# Patient Record
Sex: Female | Born: 1950 | Race: White | Hispanic: No | Smoking: Never smoker
Health system: Southern US, Community
[De-identification: ages and names within clinical notes are randomized; demographics above are authoritative.]

## PROBLEM LIST (undated history)

## (undated) DIAGNOSIS — D649 Anemia, unspecified: Secondary | ICD-10-CM

## (undated) DIAGNOSIS — Z9889 Other specified postprocedural states: Secondary | ICD-10-CM

## (undated) DIAGNOSIS — R51 Headache: Secondary | ICD-10-CM

## (undated) DIAGNOSIS — M1990 Unspecified osteoarthritis, unspecified site: Secondary | ICD-10-CM

## (undated) DIAGNOSIS — K219 Gastro-esophageal reflux disease without esophagitis: Secondary | ICD-10-CM

## (undated) DIAGNOSIS — F329 Major depressive disorder, single episode, unspecified: Secondary | ICD-10-CM

## (undated) DIAGNOSIS — R112 Nausea with vomiting, unspecified: Secondary | ICD-10-CM

## (undated) DIAGNOSIS — F32A Depression, unspecified: Secondary | ICD-10-CM

## (undated) DIAGNOSIS — Z85820 Personal history of malignant melanoma of skin: Secondary | ICD-10-CM

## (undated) HISTORY — PX: BREAST ENHANCEMENT SURGERY: SHX7

## (undated) HISTORY — PX: TONSILLECTOMY: SUR1361

---
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
## 1978-11-08 HISTORY — PX: SKIN GRAFT: SHX250

## 2000-07-30 ENCOUNTER — Encounter: Admission: RE | Admit: 2000-07-30 | Discharge: 2000-07-30 | Payer: Self-pay | Admitting: Oncology

## 2000-07-30 ENCOUNTER — Encounter: Payer: Self-pay | Admitting: Oncology

## 2009-05-07 DIAGNOSIS — C439 Malignant melanoma of skin, unspecified: Secondary | ICD-10-CM | POA: Insufficient documentation

## 2009-05-07 DIAGNOSIS — G039 Meningitis, unspecified: Secondary | ICD-10-CM | POA: Insufficient documentation

## 2012-07-12 ENCOUNTER — Other Ambulatory Visit (HOSPITAL_COMMUNITY): Payer: Self-pay | Admitting: Orthopaedic Surgery

## 2012-07-20 ENCOUNTER — Encounter (HOSPITAL_COMMUNITY): Payer: Self-pay | Admitting: Pharmacy Technician

## 2012-07-20 ENCOUNTER — Encounter (HOSPITAL_COMMUNITY)
Admission: RE | Admit: 2012-07-20 | Discharge: 2012-07-20 | Disposition: A | Discharge status: Home / Self Care | Payer: Commercial Managed Care - PPO | Source: Ambulatory Visit | Attending: Orthopaedic Surgery | Admitting: Orthopaedic Surgery

## 2012-07-20 ENCOUNTER — Encounter (HOSPITAL_COMMUNITY): Payer: Self-pay

## 2012-07-20 DIAGNOSIS — Z0183 Encounter for blood typing: Secondary | ICD-10-CM | POA: Insufficient documentation

## 2012-07-20 DIAGNOSIS — Z01812 Encounter for preprocedural laboratory examination: Secondary | ICD-10-CM | POA: Insufficient documentation

## 2012-07-20 DIAGNOSIS — K219 Gastro-esophageal reflux disease without esophagitis: Secondary | ICD-10-CM | POA: Insufficient documentation

## 2012-07-20 DIAGNOSIS — M1610 Unilateral primary osteoarthritis, unspecified hip: Secondary | ICD-10-CM | POA: Insufficient documentation

## 2012-07-20 HISTORY — DX: Depression, unspecified: F32.A

## 2012-07-20 HISTORY — DX: Headache: R51

## 2012-07-20 HISTORY — DX: Personal history of malignant melanoma of skin: Z85.820

## 2012-07-20 HISTORY — DX: Unspecified osteoarthritis, unspecified site: M19.90

## 2012-07-20 HISTORY — DX: Anemia, unspecified: D64.9

## 2012-07-20 HISTORY — DX: Gastro-esophageal reflux disease without esophagitis: K21.9

## 2012-07-20 HISTORY — DX: Major depressive disorder, single episode, unspecified: F32.9

## 2012-07-20 HISTORY — DX: Other specified postprocedural states: Z98.890

## 2012-07-20 HISTORY — DX: Other specified postprocedural states: R11.2

## 2012-07-20 LAB — BASIC METABOLIC PANEL
BUN: 18 mg/dL (ref 6–23)
CO2: 30 mEq/L (ref 19–32)
Chloride: 101 mEq/L (ref 96–112)
Creatinine, Ser: 0.72 mg/dL (ref 0.50–1.10)
GFR calc Af Amer: >90 mL/min (ref >90)
Glucose, Bld: 84 mg/dL (ref 70–99)
Potassium: 4.3 mEq/L (ref 3.5–5.1)

## 2012-07-20 LAB — URINALYSIS, ROUTINE W REFLEX MICROSCOPIC
Bilirubin Urine: NEGATIVE
Glucose, UA: NEGATIVE mg/dL
Protein, ur: NEGATIVE mg/dL
Urobilinogen, UA: 0.2 mg/dL (ref 0.0–1.0)

## 2012-07-20 LAB — URINE MICROSCOPIC-ADD ON

## 2012-07-20 LAB — APTT: aPTT: 29 seconds (ref 24–37)

## 2012-07-20 LAB — CBC
HCT: 37.8 % (ref 36.0–46.0)
Hemoglobin: 12.7 g/dL (ref 12.0–15.0)
MCHC: 33.6 g/dL (ref 30.0–36.0)
MCV: 92.2 fL (ref 78.0–100.0)
RDW: 12.5 % (ref 11.5–15.5)

## 2012-07-20 LAB — SURGICAL PCR SCREEN
MRSA, PCR: NEGATIVE
Staphylococcus aureus: POSITIVE — AB

## 2012-07-20 LAB — ABO/RH: ABO/RH(D): O POS

## 2012-07-20 NOTE — Patient Instructions (Addendum)
Autumn Lindsey  07/20/2012                           YOUR PROCEDURE IS SCHEDULED ON: 5/23 /14                PLEASE REPORT TO SHORT STAY CENTER AT : 5:15 AM               CALL THIS NUMBER IF ANY PROBLEMS THE DAY OF SURGERY :               [phone]                      REMEMBER:   Do not eat food or drink liquids AFTER MIDNIGHT   Take these medicines the morning of surgery with A SIP OF WATER:  NONE   Do not wear jewelry, make-up   Do not wear lotions, powders, or perfumes.   Do not shave legs or underarms 12 hrs. before surgery (men may shave face)  Do not bring valuables to the hospital.  Contacts, dentures or bridgework may not be worn into surgery.  Leave suitcase in the car. After surgery it may be brought to your room.  For patients admitted to the hospital more than one night, checkout time is 11:00                          The day of discharge.   Patients discharged the day of surgery will not be allowed to drive home                             If going home same day of surgery, must have someone stay with you first                           24 hrs at home and arrange for some one to drive you home from hospital.    Special Instructions:   Please read over the following fact sheets that you were given:               1. MRSA  INFORMATION                      2. [facility] PREPARING FOR SURGERY SHEET                                                X_____________________________________________________________________        Failure to follow these instructions may result in cancellation of your surgery

## 2012-07-29 ENCOUNTER — Encounter (HOSPITAL_COMMUNITY): Payer: Self-pay | Admitting: Certified Registered Nurse Anesthetist

## 2012-07-29 ENCOUNTER — Ambulatory Visit (HOSPITAL_COMMUNITY): Payer: Commercial Managed Care - PPO

## 2012-07-29 ENCOUNTER — Encounter (HOSPITAL_COMMUNITY): Payer: Self-pay | Admitting: *Deleted

## 2012-07-29 ENCOUNTER — Inpatient Hospital Stay (HOSPITAL_COMMUNITY)
Admission: RE | Admit: 2012-07-29 | Discharge: 2012-08-01 | DRG: 470 | Disposition: A | Discharge status: Home Health | Payer: Commercial Managed Care - PPO | Source: Ambulatory Visit | Attending: Orthopaedic Surgery | Admitting: Orthopaedic Surgery

## 2012-07-29 ENCOUNTER — Encounter (HOSPITAL_COMMUNITY)
Admission: RE | Disposition: A | Discharge status: Home Health | Payer: Self-pay | Source: Ambulatory Visit | Attending: Orthopaedic Surgery

## 2012-07-29 ENCOUNTER — Ambulatory Visit (HOSPITAL_COMMUNITY): Payer: Commercial Managed Care - PPO | Admitting: Certified Registered Nurse Anesthetist

## 2012-07-29 DIAGNOSIS — F3289 Other specified depressive episodes: Secondary | ICD-10-CM | POA: Diagnosis present

## 2012-07-29 DIAGNOSIS — M169 Osteoarthritis of hip, unspecified: Secondary | ICD-10-CM

## 2012-07-29 DIAGNOSIS — M1610 Unilateral primary osteoarthritis, unspecified hip: Principal | ICD-10-CM | POA: Diagnosis present

## 2012-07-29 DIAGNOSIS — D62 Acute posthemorrhagic anemia: Secondary | ICD-10-CM | POA: Diagnosis not present

## 2012-07-29 DIAGNOSIS — K219 Gastro-esophageal reflux disease without esophagitis: Secondary | ICD-10-CM | POA: Diagnosis present

## 2012-07-29 DIAGNOSIS — Z79899 Other long term (current) drug therapy: Secondary | ICD-10-CM

## 2012-07-29 DIAGNOSIS — F329 Major depressive disorder, single episode, unspecified: Secondary | ICD-10-CM | POA: Diagnosis present

## 2012-07-29 HISTORY — PX: TOTAL HIP ARTHROPLASTY: SHX124

## 2012-07-29 LAB — TYPE AND SCREEN: Antibody Screen: NEGATIVE

## 2012-07-29 SURGERY — ARTHROPLASTY, HIP, TOTAL, ANTERIOR APPROACH
Anesthesia: Spinal | Site: Hip | Laterality: Right | Wound class: Clean

## 2012-07-29 MED ORDER — ALUM & MAG HYDROXIDE-SIMETH 200-200-20 MG/5ML PO SUSP: 30.0000 mL | ORAL | Status: DC | PRN

## 2012-07-29 MED ORDER — FENTANYL CITRATE 0.05 MG/ML IJ SOLN
INTRAMUSCULAR | Status: DC | PRN
  Administered 2012-07-29: 50 ug via INTRAVENOUS

## 2012-07-29 MED ORDER — CEFAZOLIN SODIUM-DEXTROSE 2-3 GM-% IV SOLR
2.0000 g | INTRAVENOUS | Status: AC
  Administered 2012-07-29: 2 g via INTRAVENOUS

## 2012-07-29 MED ORDER — BUPIVACAINE IN DEXTROSE 0.75-8.25 % IT SOLN
INTRATHECAL | Status: DC | PRN
  Administered 2012-07-29: 2 mL via INTRATHECAL

## 2012-07-29 MED ORDER — HYDROMORPHONE HCL PF 1 MG/ML IJ SOLN
0.2500 mg | INTRAMUSCULAR | Status: DC | PRN
  Administered 2012-07-29 (×2): 0.5 mg via INTRAVENOUS

## 2012-07-29 MED ORDER — STERILE WATER FOR IRRIGATION IR SOLN
Status: DC | PRN
  Administered 2012-07-29: 3000 mL

## 2012-07-29 MED ORDER — PROPOFOL INFUSION 10 MG/ML OPTIME
INTRAVENOUS | Status: DC | PRN
  Administered 2012-07-29: 160 ug/kg/min via INTRAVENOUS

## 2012-07-29 MED ORDER — DEXTROSE 5 % IV SOLN
500.0000 mg | Freq: Four times a day (QID) | INTRAVENOUS | Status: DC | PRN
  Administered 2012-07-29 – 2012-07-30 (×2): 500 mg via INTRAVENOUS
  Filled 2012-07-29: qty 5

## 2012-07-29 MED ORDER — ASPIRIN EC 325 MG PO TBEC
325.0000 mg | DELAYED_RELEASE_TABLET | Freq: Two times a day (BID) | ORAL | Status: DC
  Administered 2012-07-29 – 2012-08-01 (×6): 325 mg via ORAL
  Filled 2012-07-29 (×8): qty 1

## 2012-07-29 MED ORDER — LACTATED RINGERS IV SOLN: INTRAVENOUS | Status: DC

## 2012-07-29 MED ORDER — ESTRADIOL-NORETHINDRONE ACET 1-0.5 MG PO TABS
1.0000 | ORAL_TABLET | Freq: Every day | ORAL | Status: DC
  Administered 2012-07-30 – 2012-07-31 (×2): 1 via ORAL
  Filled 2012-07-29 (×2): qty 1

## 2012-07-29 MED ORDER — METHOCARBAMOL 500 MG PO TABS
500.0000 mg | ORAL_TABLET | Freq: Four times a day (QID) | ORAL | Status: DC | PRN
  Administered 2012-07-29 – 2012-07-31 (×4): 500 mg via ORAL
  Filled 2012-07-29 (×6): qty 1

## 2012-07-29 MED ORDER — ACETAMINOPHEN 10 MG/ML IV SOLN: 1000.0000 mg | Freq: Once | INTRAVENOUS | Status: DC | PRN

## 2012-07-29 MED ORDER — OXYCODONE HCL ER 20 MG PO T12A
20.0000 mg | EXTENDED_RELEASE_TABLET | Freq: Two times a day (BID) | ORAL | Status: DC
  Administered 2012-07-29 – 2012-08-01 (×5): 20 mg via ORAL
  Filled 2012-07-29 (×5): qty 1

## 2012-07-29 MED ORDER — OXYCODONE HCL 5 MG PO TABS
5.0000 mg | ORAL_TABLET | ORAL | Status: DC | PRN
  Administered 2012-07-30 – 2012-07-31 (×5): 10 mg via ORAL
  Administered 2012-07-31: 5 mg via ORAL
  Administered 2012-07-31: 10 mg via ORAL
  Filled 2012-07-29 (×7): qty 2
  Filled 2012-07-29 (×2): qty 1

## 2012-07-29 MED ORDER — MUPIROCIN 2 % EX OINT
TOPICAL_OINTMENT | CUTANEOUS | Status: AC
  Filled 2012-07-29: qty 22

## 2012-07-29 MED ORDER — POLYETHYLENE GLYCOL 3350 17 G PO PACK
17.0000 g | PACK | Freq: Every day | ORAL | Status: DC | PRN
  Administered 2012-07-30 – 2012-07-31 (×2): 17 g via ORAL

## 2012-07-29 MED ORDER — PROMETHAZINE HCL 25 MG/ML IJ SOLN: 6.2500 mg | INTRAMUSCULAR | Status: DC | PRN

## 2012-07-29 MED ORDER — CEFAZOLIN SODIUM 1-5 GM-% IV SOLN
1.0000 g | Freq: Four times a day (QID) | INTRAVENOUS | Status: AC
  Administered 2012-07-29 (×2): 1 g via INTRAVENOUS
  Filled 2012-07-29 (×2): qty 50

## 2012-07-29 MED ORDER — FERROUS SULFATE 325 (65 FE) MG PO TABS
325.0000 mg | ORAL_TABLET | Freq: Three times a day (TID) | ORAL | Status: DC
  Administered 2012-07-30 – 2012-08-01 (×7): 325 mg via ORAL
  Filled 2012-07-29 (×12): qty 1

## 2012-07-29 MED ORDER — SODIUM CHLORIDE 0.9 % IV SOLN
INTRAVENOUS | Status: DC
  Administered 2012-07-29: 75 mL/h via INTRAVENOUS
  Administered 2012-07-30: 03:00:00 via INTRAVENOUS

## 2012-07-29 MED ORDER — MUPIROCIN 2 % EX OINT
TOPICAL_OINTMENT | Freq: Two times a day (BID) | CUTANEOUS | Status: DC
  Administered 2012-07-29: 06:00:00 via NASAL

## 2012-07-29 MED ORDER — HYDROMORPHONE HCL PF 1 MG/ML IJ SOLN: 1.0000 mg | INTRAMUSCULAR | Status: DC | PRN

## 2012-07-29 MED ORDER — PROPOFOL 10 MG/ML IV BOLUS
INTRAVENOUS | Status: DC | PRN
  Administered 2012-07-29: 30 mg via INTRAVENOUS

## 2012-07-29 MED ORDER — CITALOPRAM HYDROBROMIDE 20 MG PO TABS
20.0000 mg | ORAL_TABLET | Freq: Every evening | ORAL | Status: DC
  Administered 2012-07-29 – 2012-07-31 (×3): 20 mg via ORAL
  Filled 2012-07-29 (×4): qty 1

## 2012-07-29 MED ORDER — ZOLPIDEM TARTRATE 5 MG PO TABS: 5.0000 mg | ORAL_TABLET | Freq: Every evening | ORAL | Status: DC | PRN

## 2012-07-29 MED ORDER — ACETAMINOPHEN 325 MG PO TABS: 650.0000 mg | ORAL_TABLET | Freq: Four times a day (QID) | ORAL | Status: DC | PRN

## 2012-07-29 MED ORDER — SODIUM CHLORIDE 0.9 % IV SOLN
INTRAVENOUS | Status: DC | PRN
  Administered 2012-07-29: 1000 mL

## 2012-07-29 MED ORDER — METOCLOPRAMIDE HCL 5 MG/ML IJ SOLN: 5.0000 mg | Freq: Three times a day (TID) | INTRAMUSCULAR | Status: DC | PRN

## 2012-07-29 MED ORDER — OXYCODONE HCL 5 MG PO TABS: 5.0000 mg | ORAL_TABLET | Freq: Once | ORAL | Status: DC | PRN

## 2012-07-29 MED ORDER — METOCLOPRAMIDE HCL 10 MG PO TABS: 5.0000 mg | ORAL_TABLET | Freq: Three times a day (TID) | ORAL | Status: DC | PRN

## 2012-07-29 MED ORDER — ONDANSETRON HCL 4 MG PO TABS: 4.0000 mg | ORAL_TABLET | Freq: Four times a day (QID) | ORAL | Status: DC | PRN

## 2012-07-29 MED ORDER — MENTHOL 3 MG MT LOZG
1.0000 | LOZENGE | OROMUCOSAL | Status: DC | PRN
  Filled 2012-07-29: qty 9

## 2012-07-29 MED ORDER — HYDROMORPHONE HCL PF 1 MG/ML IJ SOLN
1.0000 mg | INTRAMUSCULAR | Status: DC | PRN
  Administered 2012-07-29 – 2012-07-30 (×4): 1 mg via INTRAVENOUS
  Filled 2012-07-29 (×4): qty 1

## 2012-07-29 MED ORDER — PHENOL 1.4 % MT LIQD
1.0000 | OROMUCOSAL | Status: DC | PRN
  Filled 2012-07-29: qty 177

## 2012-07-29 MED ORDER — 0.9 % SODIUM CHLORIDE (POUR BTL) OPTIME
TOPICAL | Status: DC | PRN
  Administered 2012-07-29: 1000 mL

## 2012-07-29 MED ORDER — BISACODYL 5 MG PO TBEC: 5.0000 mg | DELAYED_RELEASE_TABLET | Freq: Every day | ORAL | Status: DC | PRN

## 2012-07-29 MED ORDER — LACTATED RINGERS IV SOLN
INTRAVENOUS | Status: DC | PRN
  Administered 2012-07-29 (×4): via INTRAVENOUS

## 2012-07-29 MED ORDER — OXYCODONE HCL 5 MG/5ML PO SOLN
5.0000 mg | Freq: Once | ORAL | Status: DC | PRN
  Filled 2012-07-29: qty 5

## 2012-07-29 MED ORDER — ONDANSETRON HCL 4 MG/2ML IJ SOLN
4.0000 mg | Freq: Four times a day (QID) | INTRAMUSCULAR | Status: DC | PRN
  Administered 2012-07-29 – 2012-07-30 (×2): 4 mg via INTRAVENOUS
  Filled 2012-07-29 (×2): qty 2

## 2012-07-29 MED ORDER — ACETAMINOPHEN 10 MG/ML IV SOLN
INTRAVENOUS | Status: DC | PRN
  Administered 2012-07-29: 1000 mg via INTRAVENOUS

## 2012-07-29 MED ORDER — DOCUSATE SODIUM 100 MG PO CAPS
100.0000 mg | ORAL_CAPSULE | Freq: Two times a day (BID) | ORAL | Status: DC
  Administered 2012-07-29 – 2012-08-01 (×7): 100 mg via ORAL

## 2012-07-29 MED ORDER — ADULT MULTIVITAMIN W/MINERALS CH
1.0000 | ORAL_TABLET | Freq: Every day | ORAL | Status: DC
  Administered 2012-07-30 – 2012-08-01 (×3): 1 via ORAL
  Filled 2012-07-29 (×4): qty 1

## 2012-07-29 MED ORDER — MIDAZOLAM HCL 5 MG/5ML IJ SOLN
INTRAMUSCULAR | Status: DC | PRN
  Administered 2012-07-29: 2 mg via INTRAVENOUS

## 2012-07-29 MED ORDER — ACETAMINOPHEN 650 MG RE SUPP: 650.0000 mg | Freq: Four times a day (QID) | RECTAL | Status: DC | PRN

## 2012-07-29 MED ORDER — DIPHENHYDRAMINE HCL 12.5 MG/5ML PO ELIX: 12.5000 mg | ORAL_SOLUTION | ORAL | Status: DC | PRN

## 2012-07-29 MED ORDER — MEPERIDINE HCL 50 MG/ML IJ SOLN: 6.2500 mg | INTRAMUSCULAR | Status: DC | PRN

## 2012-07-29 SURGICAL SUPPLY — 38 items
BAG ZIPLOCK 12X15 (MISCELLANEOUS) ×4 IMPLANT
BLADE SAW SGTL 18X1.27X75 (BLADE) ×2 IMPLANT
CATH FOLEY 2WAY SLVR  5CC 14FR (CATHETERS) ×1
CATH FOLEY 2WAY SLVR 5CC 14FR (CATHETERS) ×1 IMPLANT
CELLS DAT CNTRL 66122 CELL SVR (MISCELLANEOUS) ×1 IMPLANT
CLOTH BEACON ORANGE TIMEOUT ST (SAFETY) ×2 IMPLANT
DERMABOND ADVANCED (GAUZE/BANDAGES/DRESSINGS) ×1
DERMABOND ADVANCED .7 DNX12 (GAUZE/BANDAGES/DRESSINGS) ×1 IMPLANT
DRAPE C-ARM 42X72 X-RAY (DRAPES) ×2 IMPLANT
DRAPE STERI IOBAN 125X83 (DRAPES) ×2 IMPLANT
DRAPE U-SHAPE 47X51 STRL (DRAPES) ×6 IMPLANT
DRSG AQUACEL AG ADV 3.5X10 (GAUZE/BANDAGES/DRESSINGS) ×2 IMPLANT
DURAPREP 26ML APPLICATOR (WOUND CARE) ×2 IMPLANT
ELECT BLADE TIP CTD 4 INCH (ELECTRODE) ×2 IMPLANT
ELECT REM PT RETURN 9FT ADLT (ELECTROSURGICAL) ×2
ELECTRODE REM PT RTRN 9FT ADLT (ELECTROSURGICAL) ×1 IMPLANT
FACESHIELD LNG OPTICON STERILE (SAFETY) ×8 IMPLANT
GLOVE BIO SURGEON STRL SZ7.5 (GLOVE) ×2 IMPLANT
GLOVE BIOGEL PI IND STRL 8 (GLOVE) ×2 IMPLANT
GLOVE BIOGEL PI INDICATOR 8 (GLOVE) ×2
GLOVE ECLIPSE 8.0 STRL XLNG CF (GLOVE) ×2 IMPLANT
GOWN STRL REIN XL XLG (GOWN DISPOSABLE) ×4 IMPLANT
HANDPIECE INTERPULSE COAX TIP (DISPOSABLE) ×1
KIT BASIN OR (CUSTOM PROCEDURE TRAY) ×2 IMPLANT
PACK TOTAL JOINT (CUSTOM PROCEDURE TRAY) ×2 IMPLANT
PADDING CAST COTTON 6X4 STRL (CAST SUPPLIES) ×2 IMPLANT
RTRCTR WOUND ALEXIS 18CM MED (MISCELLANEOUS) ×2
SET HNDPC FAN SPRY TIP SCT (DISPOSABLE) ×1 IMPLANT
SUT ETHIBOND NAB CT1 #1 30IN (SUTURE) ×4 IMPLANT
SUT ETHILON 3 0 PS 1 (SUTURE) ×2 IMPLANT
SUT MNCRL AB 4-0 PS2 18 (SUTURE) ×2 IMPLANT
SUT VIC AB 0 CT1 36 (SUTURE) ×2 IMPLANT
SUT VIC AB 1 CT1 36 (SUTURE) ×4 IMPLANT
SUT VIC AB 2-0 CT1 27 (SUTURE) ×2
SUT VIC AB 2-0 CT1 TAPERPNT 27 (SUTURE) ×2 IMPLANT
TOWEL OR 17X26 10 PK STRL BLUE (TOWEL DISPOSABLE) ×2 IMPLANT
TOWEL OR NON WOVEN STRL DISP B (DISPOSABLE) ×2 IMPLANT
TRAY FOLEY CATH 14FRSI W/METER (CATHETERS) ×2 IMPLANT

## 2012-07-29 NOTE — Progress Notes (Signed)
Portable AP Pelvis and Lateral Right Hip X-rays done. 

## 2012-07-29 NOTE — Anesthesia Postprocedure Evaluation (Signed)
Anesthesia Post Note  Patient: Autumn Lindsey  Procedure(s) Performed: Procedure(s) (LRB): RIGHT TOTAL HIP ARTHROPLASTY ANTERIOR APPROACH (Right)  Anesthesia type: Spinal  Patient location: PACU  Post pain: Pain level controlled  Post assessment: Post-op Vital signs reviewed  Last Vitals: BP 90/55  Pulse 63  Temp(Src) 36.4 C (Oral)  Resp 16  SpO2 98%  Post vital signs: Reviewed  Level of consciousness: sedated  Complications: No apparent anesthesia complications

## 2012-07-29 NOTE — Progress Notes (Signed)
UR completed 

## 2012-07-29 NOTE — H&P (Signed)
TOTAL HIP ADMISSION H&P  Patient is admitted for right total hip arthroplasty.  Subjective:  Chief Complaint: right hip pain  HPI: Autumn Lindsey, 62 y.o. female, has a history of pain and functional disability in the right hip(s) due to arthritis and patient has failed non-surgical conservative treatments for greater than 12 weeks to include NSAID's and/or analgesics, corticosteriod injections, flexibility and strengthening excercises and activity modification.  Onset of symptoms was gradual starting >10 years ago with gradually worsening course since that time.The patient noted no past surgery on the right hip(s).  Patient currently rates pain in the right hip at 10 out of 10 with activity. Patient has night pain, worsening of pain with activity and weight bearing, pain that interfers with activities of daily living and pain with passive range of motion. Patient has evidence of subchondral sclerosis, periarticular osteophytes and joint space narrowing by imaging studies. This condition presents safety issues increasing the risk of falls.  There is no current active infection.  Patient Active Problem List   Diagnosis Date Noted  . Degenerative arthritis of hip 07/29/2012   Past Medical History  Diagnosis Date  . PONV (postoperative nausea and vomiting)   . Headache     in past  . Arthritis   . History of melanoma excision   . GERD (gastroesophageal reflux disease)   . Anemia   . Depression     Past Surgical History  Procedure Laterality Date  . Appendectomy  1970's  . Tonsillectomy    . Skin graft  1980's  . Breast enhancement surgery      Prescriptions prior to admission  Medication Sig Dispense Refill  . Calcium Carbonate-Vitamin D (CALCIUM 600 + D PO) Take 1 tablet by mouth daily.      . citalopram (CELEXA) 20 MG tablet Take 20 mg by mouth every evening.      Marland Kitchen estradiol-norethindrone (ACTIVELLA) 1-0.5 MG per tablet Take 1 tablet by mouth daily.      . fish oil-omega-3  fatty acids 1000 MG capsule Take 1 g by mouth 2 (two) times daily.      . Multiple Vitamin (MULTIVITAMIN WITH MINERALS) TABS Take 1 tablet by mouth daily.      . Multiple Vitamins-Minerals (PRESERVISION AREDS 2) CAPS Take 1 capsule by mouth 2 (two) times daily.      . naproxen sodium (ANAPROX) 220 MG tablet Take 220 mg by mouth 2 (two) times daily as needed (for pain).       No Known Allergies  History  Substance Use Topics  . Smoking status: Never Smoker   . Smokeless tobacco: Not on file  . Alcohol Use: Yes     Comment: occasional    History reviewed. No pertinent family history.   Review of Systems  Musculoskeletal: Positive for joint pain.  All other systems reviewed and are negative.    Objective:  Physical Exam  Constitutional: She is oriented to person, place, and time. She appears well-developed and well-nourished.  HENT:  Head: Normocephalic and atraumatic.  Eyes: EOM are normal. Pupils are equal, round, and reactive to light.  Neck: Normal range of motion. Neck supple.  Cardiovascular: Normal rate and regular rhythm.   Respiratory: Effort normal and breath sounds normal.  GI: Soft. Bowel sounds are normal.  Musculoskeletal:       Right hip: She exhibits decreased range of motion, decreased strength and bony tenderness.  Neurological: She is alert and oriented to person, place, and time.  Skin: Skin  is warm and dry.  Psychiatric: She has a normal mood and affect.    Vital signs in last 24 hours: Temp:  [97 F (36.1 C)] 97 F (36.1 C) (05/23 0510) Pulse Rate:  [72] 72 (05/23 0510) Resp:  [18] 18 (05/23 0510) BP: (98)/(64) 98/64 mmHg (05/23 0510) SpO2:  [98 %] 98 % (05/23 0510)  Labs:   There is no weight on file to calculate BMI.   Imaging Review Plain radiographs demonstrate severe degenerative joint disease of the right hip(s). The bone quality appears to be excellent for age and reported activity level.  Assessment/Plan:  End stage arthritis,  right hip(s)  The patient history, physical examination, clinical judgement of the provider and imaging studies are consistent with end stage degenerative joint disease of the right hip(s) and total hip arthroplasty is deemed medically necessary. The treatment options including medical management, injection therapy, arthroscopy and arthroplasty were discussed at length. The risks and benefits of total hip arthroplasty were presented and reviewed. The risks due to aseptic loosening, infection, stiffness, dislocation/subluxation,  thromboembolic complications and other imponderables were discussed.  The patient acknowledged the explanation, agreed to proceed with the plan and consent was signed. Patient is being admitted for inpatient treatment for surgery, pain control, PT, OT, prophylactic antibiotics, VTE prophylaxis, progressive ambulation and ADL's and discharge planning.The patient is planning to be discharged home with home health services

## 2012-07-29 NOTE — Progress Notes (Signed)
Blood pressure 82/47- I.V. Fluid rate increased.

## 2012-07-29 NOTE — Progress Notes (Signed)
I.V. Fluid rate decreased to 75 cc/hour- on pump.

## 2012-07-29 NOTE — Progress Notes (Signed)
O.K. For patient to go to floor- per Dr. Germeroth 

## 2012-07-29 NOTE — Progress Notes (Signed)
Dr.Germeroth in- made aware of patient's vital signs- spinal level

## 2012-07-29 NOTE — Progress Notes (Addendum)
Received via bed from the PACU, alert, IV infusing, some tingling from knees down from spinal anesthesia, no complaints of pain/discomfort at this time, alert and oriented.  Given gingerale sips, oriented to room, placed on continuous O2 monitor, oxygen at 2L via nasal cannula.  Patient's husband has Alzheimers and if anything needed please call Inda Castle at [phone] or [phone].

## 2012-07-29 NOTE — Evaluation (Signed)
Physical Therapy Evaluation Patient Details Name: Autumn Lindsey MRN: [id number] DOB: 1950-03-29 Today's Date: 07/29/2012 Time: 4696-2952 PT Time Calculation (min): 45 min  PT Assessment / Plan / Recommendation Clinical Impression  Pt with RTHA presents with decreased mobility to benefit from PT to increase mobility to MODI to return home to care for her husband as needed.     PT Assessment  Patient needs continued PT services    Follow Up Recommendations  Home health PT;Supervision/Assistance - 24 hour (pt will hae asssit at hme for 2 wks for her and her husband.)    Does the patient have the potential to tolerate intense rehabilitation      Barriers to Discharge        Equipment Recommendations  None recommended by PT (pt has friends RW)    Recommendations for Other Services     Frequency 7X/week    Precautions / Restrictions Precautions Precautions: None (direct anterior approach) Restrictions Weight Bearing Restrictions: Yes RLE Weight Bearing: Weight bearing as tolerated   Pertinent Vitals/Pain Pt with very little pain, just had medication. Reported less than 3/10.     Mobility  Bed Mobility Bed Mobility: Supine to Sit;Sit to Supine Supine to Sit: 4: Min assist;With rails;HOB elevated Sit to Supine: Not Tested (comment) Details for Bed Mobility Assistance: cues for safety and sequencing and assist with RLE Transfers Transfers: Sit to Stand;Stand to Sit Sit to Stand: 4: Min assist;From bed;With upper extremity assist Stand to Sit: 4: Min assist;With upper extremity assist;To chair/3-in-1 Details for Transfer Assistance: cues for safety with RW  Ambulation/Gait Ambulation/Gait Assistance: 4: Min assist Ambulation Distance (Feet): 10 Feet Assistive device: Rolling walker Ambulation/Gait Assistance Details: step t pattrn with cues for sequencing. could have gone further however limited by low BP and slight symptoms of dizziness 89/53. Symtoms resolved after  sitting.  Gait Pattern: Step-to pattern Stairs: No    Exercises Total Joint Exercises Ankle Circles/Pumps: AROM;Right;10 reps;Supine Quad Sets: AROM;Right;10 reps;Supine Heel Slides: AAROM;Right;10 reps;Supine Hip ABduction/ADduction: AAROM;Right;10 reps;Supine   PT Diagnosis: Difficulty walking;Acute pain  PT Problem List: Decreased strength;Decreased range of motion;Decreased activity tolerance;Decreased mobility;Decreased knowledge of use of DME PT Treatment Interventions: DME instruction;Gait training;Stair training;Functional mobility training;Therapeutic activities;Therapeutic exercise;Patient/family education   PT Goals Acute Rehab PT Goals PT Goal Formulation: With patient Time For Goal Achievement: 08/05/12 Potential to Achieve Goals: Good Pt will go Supine/Side to Sit: with modified independence PT Goal: Supine/Side to Sit - Progress: Goal set today Pt will go Sit to Supine/Side: with modified independence PT Goal: Sit to Supine/Side - Progress: Goal set today Pt will go Sit to Stand: with modified independence PT Goal: Sit to Stand - Progress: Progressing toward goal Pt will go Stand to Sit: with modified independence PT Goal: Stand to Sit - Progress: Goal set today Pt will Ambulate: 51 - 150 feet;with supervision;with rolling walker PT Goal: Ambulate - Progress: Goal set today Pt will Go Up / Down Stairs: 3-5 stairs;with least restrictive assistive device;with min assist (with assist and no rail) PT Goal: Up/Down Stairs - Progress: Goal set today Pt will Perform Home Exercise Program: with supervision, verbal cues required/provided PT Goal: Perform Home Exercise Program - Progress: Goal set today  Visit Information  Last PT Received On: 07/29/12    Subjective Data  Subjective: It feels good to move a little. Patient Stated Goal: TO head home   Prior Functioning  Home Living Lives With: Spouse Available Help at Discharge: Family;Friend(s) Type of Home:  House Home  Access: Stairs to enter [employer] of Steps: 3 Entrance Stairs-Rails: None Home Layout: Two level Home Adaptive Equipment: Walker - rolling;Straight cane (borrowe from friend) Additional Comments: Pt has 30 yo son to assist at home, as well as friends and family for first 2 weeks to assist with caring for patient's husband. Pt can lstay o first level first few nights if need. There is a half bath and sofa for her to stay on main level if needed.  Prior Function Level of Independence: Independent Able to Take Stairs?: Yes Driving: Yes Vocation: Full time employment Communication Communication: No difficulties    Cognition  Cognition Arousal/Alertness: Awake/alert Behavior During Therapy: WFL for tasks assessed/performed Overall Cognitive Status: Within Functional Limits for tasks assessed    Extremity/Trunk Assessment Right Lower Extremity Assessment RLE ROM/Strength/Tone: Deficits RLE ROM/Strength/Tone Deficits: limited slight due to surgical pain.  RLE Sensation: WFL - Light Touch Left Lower Extremity Assessment LLE ROM/Strength/Tone: Within functional levels LLE Sensation: WFL - Light Touch   Balance    End of Session PT - End of Session Equipment Utilized During Treatment: Gait belt Activity Tolerance: Patient tolerated treatment well Patient left: in chair;with call bell/phone within reach Nurse Communication: Mobility status  GP     Marella Bile 07/29/2012, 6:19 PM Marella Bile, PT Pager: [phone] 07/29/2012

## 2012-07-29 NOTE — Brief Op Note (Signed)
07/29/2012  9:19 AM  PATIENT:  Autumn Lindsey  62 y.o. female  PRE-OPERATIVE DIAGNOSIS:  Right hip arthritis  POST-OPERATIVE DIAGNOSIS:  Right hip arthritis  PROCEDURE:  Procedure(s): RIGHT TOTAL HIP ARTHROPLASTY ANTERIOR APPROACH (Right)  SURGEON:  Surgeon(s) and Role:    * Kathryne Hitch, MD - Primary  PHYSICIAN ASSISTANT:   Rexene Edison, PA-C  ANESTHESIA:   spinal  EBL:  Total I/O In: 3000 [I.V.:3000] Out: 1500 [Urine:1000; Blood:500]  BLOOD ADMINISTERED:none  DRAINS: none   LOCAL MEDICATIONS USED:  NONE  SPECIMEN:  No Specimen  DISPOSITION OF SPECIMEN:  N/A  COUNTS:  YES  TOURNIQUET:  * No tourniquets in log *  DICTATION: .Other Dictation: Dictation Number [id number]  PLAN OF CARE: Admit to inpatient   PATIENT DISPOSITION:  PACU - hemodynamically stable.   Delay start of Pharmacological VTE agent (>24hrs) due to surgical blood loss or risk of bleeding: no

## 2012-07-29 NOTE — Anesthesia Procedure Notes (Signed)
Spinal  Patient location during procedure: OR Start time: 07/29/2012 7:35 AM End time: 07/29/2012 7:39 AM Staffing Anesthesiologist: Lewie Loron R Performed by: anesthesiologist  Preanesthetic Checklist Completed: patient identified, site marked, surgical consent, pre-op evaluation, timeout performed, IV checked, risks and benefits discussed and monitors and equipment checked Spinal Block Patient position: sitting Prep: ChloraPrep Patient monitoring: heart rate, continuous pulse ox and blood pressure Approach: midline Location: L2-3 Injection technique: single-shot Needle Needle type: Sprotte  Needle gauge: 24 G Needle length: 9 cm Assessment Sensory level: T8 Additional Notes Expiration date of kit checked and confirmed. Patient tolerated procedure well, without complications.

## 2012-07-29 NOTE — Progress Notes (Signed)
RN noted difference in name of blood bracelet and ID bracelet. The difference noted was blood bracelet had her hyphened name and ID bracelet hyphen dropped.  It was noted that MR number and DOB are correct in epic and on both bracelets.  Called Beth Cramer in lab to verify if blood bracelet can used.  She states to mark thru "[facility]" and put initials which I did.

## 2012-07-29 NOTE — Progress Notes (Signed)
X-ray results noted 

## 2012-07-29 NOTE — Anesthesia Preprocedure Evaluation (Addendum)
Anesthesia Evaluation  Patient identified by MRN, date of birth, ID band Patient awake    Reviewed: Allergy & Precautions, H&P , NPO status , Patient's Chart, lab work & pertinent test results  History of Anesthesia Complications (+) PONV  Airway Mallampati: I TM Distance: >3 FB Neck ROM: Full    Dental  (+) Dental Advisory Given and Teeth Intact   Pulmonary neg pulmonary ROS,  breath sounds clear to auscultation        Cardiovascular negative cardio ROS  Rhythm:Regular Rate:Normal     Neuro/Psych  Headaches, PSYCHIATRIC DISORDERS Depression negative neurological ROS     GI/Hepatic Neg liver ROS, GERD-  Medicated,  Endo/Other  negative endocrine ROS  Renal/GU negative Renal ROS     Musculoskeletal negative musculoskeletal ROS (+)   Abdominal   Peds  Hematology negative hematology ROS (+)   Anesthesia Other Findings   Reproductive/Obstetrics negative OB ROS                          Anesthesia Physical Anesthesia Plan  ASA: II  Anesthesia Plan: Spinal   Post-op Pain Management:    Induction: Intravenous  Airway Management Planned: Simple Face Mask and Nasal Cannula  Additional Equipment:   Intra-op Plan:   Post-operative Plan:   Informed Consent: I have reviewed the patients History and Physical, chart, labs and discussed the procedure including the risks, benefits and alternatives for the proposed anesthesia with the patient or authorized representative who has indicated his/her understanding and acceptance.   Dental advisory given  Plan Discussed with: CRNA  Anesthesia Plan Comments:        Anesthesia Quick Evaluation

## 2012-07-29 NOTE — Transfer of Care (Signed)
Immediate Anesthesia Transfer of Care Note  Patient: Autumn Lindsey  Procedure(s) Performed: Procedure(s): RIGHT TOTAL HIP ARTHROPLASTY ANTERIOR APPROACH (Right)  Patient Location: PACU  Anesthesia Type:Regional and Spinal  Level of Consciousness: awake, alert , sedated and patient cooperative  Airway & Oxygen Therapy: Patient Spontanous Breathing and Patient connected to face mask oxygen  Post-op Assessment: Report given to PACU RN and Post -op Vital signs reviewed and stable  Post vital signs: Reviewed and stable  Complications: No apparent anesthesia complications

## 2012-07-30 ENCOUNTER — Encounter (HOSPITAL_COMMUNITY): Payer: Self-pay

## 2012-07-30 LAB — CBC
Hemoglobin: 8.5 g/dL — ABNORMAL LOW (ref 12.0–15.0)
MCH: 30 pg (ref 26.0–34.0)
Platelets: 169 10*3/uL (ref 150–400)
RBC: 2.83 MIL/uL — ABNORMAL LOW (ref 3.87–5.11)
WBC: 4.8 10*3/uL (ref 4.0–10.5)

## 2012-07-30 LAB — BASIC METABOLIC PANEL
CO2: 30 mEq/L (ref 19–32)
Calcium: 8 mg/dL — ABNORMAL LOW (ref 8.4–10.5)
Chloride: 102 mEq/L (ref 96–112)
Glucose, Bld: 120 mg/dL — ABNORMAL HIGH (ref 70–99)
Sodium: 135 mEq/L (ref 135–145)

## 2012-07-30 NOTE — Op Note (Signed)
NAMEVALISA, KARPEL NO.:  [id number]  MEDICAL RECORD NO.:  [id number]  LOCATION:  [facility]                         FACILITY:  [facility]  PHYSICIAN:  Vanita Panda. Magnus Ivan, M.D.DATE OF BIRTH:  [birth year]  DATE OF PROCEDURE:  07/29/2012 DATE OF DISCHARGE:  07/29/2012                              OPERATIVE REPORT   PREOPERATIVE DIAGNOSIS:  SEVERE OSTEOARTHRITIS AND DEGENERATIVE JOINT DISEASE, RIGHT HIP.  POSTOPERATIVE DIAGNOSIS:  SEVERE OSTEOARTHRITIS AND DEGENERATIVE JOINT DISEASE, RIGHT HIP.  PROCEDURE:  RIGHT TOTAL HIP ARTHROPLASTY, DIRECT ANTERIOR APPROACH.  SURGEON:  Kathryne Hitch, M.D.  ASSISTANT:  Richardean Canal, PA-C.  ANESTHESIA:  SPINAL.  ANTIBIOTICS:  2 g IV Ancef.  BLOOD LOSS:  500 mL.  COMPLICATIONS:  None.  IMPLANTS:  DePuy Sector Gription acetabular component size 50 with apex hole eliminator guide and 3 screws, size 32+ 4 neutral polyethylene liner, size 11 Corail femoral component with standard offset, size 36+ 1 ceramic hip, size 32+ 1 ceramic hip ball.  INDICATIONS:  Ms. Dalal is a 62 year old female, well known to me.  She has had hip pain for  she reports about 12 years now on the right hip that is slowly worsening.  She has tried every mode of conservative treatment and has gotten to where it affects her activities of daily living, her mobility, and her daily life.  Her pain is daily.  At this point, given the failure of conservative treatment, she wishes to proceed with a total hip arthroplasty.  She has x-rays that shows severe joint space narrowing, subchondral sclerosis, and cystic changes as well as periarticular osteophytes.  Given the nature of her pain, we feel the total hip arthroplasty could help her.  She understands the risks of acute blood-loss anemia, nerve and vessel injury, infection, fracture, and DVT and the goals are improved mobility, improved quality of life, and decreased pain.  DESCRIPTION OF  PROCEDURE:  After informed consent was obtained and appropriate right hip was marked, she was brought to the operating room and set up on a stretcher so that spinal anesthesia could be obtained. Once spinal anesthesia was obtained, she was laid back in the supine position on a stretcher.  Foley catheter was placed and then traction boots were placed on both her feet.  She was next placed supine on the Hana fracture table with both legs inline with skeletal traction, but no traction applied and a perineal post in place.  We then assessed the hip under direct fluoroscopy so we could obtain the center of the pelvis for measuring leg lengths and assessing the hip in general for placement of our components.  We then backed with C-arm out and prepped it with sterile drapes.  We then prepped the right hip with DuraPrep and sterile drapes.  A time-out was called, and she was identified as correct patient, correct right hip.  I then made an incision just inferior and posterior to the anterior superior iliac spine and carried this obliquely down the leg.  I dissected down to the tensor fascia lata muscle and the tensor fascia was then divided longitudinally.  I proceeded with a direct anterior approach to the hip at that point.  A cobra retractor was placed around the lateral neck and one up underneath the rectus femoris around the medial neck.  I cauterized the lateral femoral circumflex vessels and then I opened up the hip capsule in L- type format placing the Cobra retractors within the hip capsule and found a large joint effusion and significant arthritic changes throughout the femoral head and neck area.  Using oscillating saw to cut the femoral neck proximal to the lesser trochanter and finish this off with an osteotome.  I placed a corkscrew guide in the femoral head and removed the femoral head in its entirety.  I then placed a bent Hohmann medially and a Cobra retractor laterally.  I  cleaned the acetabulum of debris and remnants of the fovea.  I then began reaming from size 42 reamer all the way up to size 50 with all reamers placed under direct visualization.  The last reamer was also placed under direct fluoroscopy, so we could obtain our depth of reaming, our inclination, and anteversion.  Once I was pleased with this.  I placed the real DePuy Sector, Gription acetabular component size #50 with 3 screw holes, and I filled all 3 screw holes due to the fact that she is an incredibly active woman.  I also placed the apex hole eliminator guide and a real 32+ 4 neutral polyethylene liner.  Attention was then turned to the femur with the leg externally rotated to about 90 degrees extended and adducted.  I gained access to the femoral canal after placing a bent Hohmann behind the greater trochanter and a Mueller retractor medially. I released the lateral joint capsule and used a box cutting guide to open femoral canal and a rongeur to lateralize significant broaching from a size 8 broach using the Corail femoral system up to a size 11. With a size 11, I placed a calcar planer and then trialed a standard neck and a 32+ 1 hip ball.  We brought the leg back over and up with traction and internal rotation, reduced into the pelvis and it was stable with internal and external rotation.  Leg lengths were measured to be near equal under direct fluoroscopy.  We then dislocated the hip again and removed the trial components.  I placed then the real Corail femoral component with standard offset and the real 36+ 1 ceramic hip ball and reduced this in the pelvis again and it was stable.  We then copiously irrigated the soft tissue with normal saline solution using pulsatile lavage.  I closed the joint capsule with #1 Ethibond suture followed by a running #1 Vicryl in the tensor fascia, 0 Vicryl in deep tissue, 2-0 Vicryl in the subcutaneous tissue, 4-0 Monocryl subcuticular stitch and  Dermabond on the skin.  Well-padded Aquacel dressing was applied and she was taken off the Hana table into the recovery room in stable condition.  All final counts were correct with no complications noted.  Of note, Richardean Canal, [facility] was present for the entire case and his presence was crucial for retracting tissues up to gain access to the hip joint and closing the wound.     Vanita Panda. Magnus Ivan, M.D.     CYB/MEDQ  D:  07/29/2012  T:  07/29/2012  Job:  [id number]  cc:   Richardean Canal, P.A.

## 2012-07-30 NOTE — Evaluation (Signed)
Occupational Therapy Evaluation Patient Details Name: Autumn Lindsey MRN: [id number] DOB: 28-Jul-1950 Today's Date: 07/30/2012 Time: 4010-2725 OT Time Calculation (min): 29 min  OT Assessment / Plan / Recommendation Clinical Impression  This 62 year old female was admitted for R anterior direct THA.  Pt was independent prior to admission, worked FT and cared for husband (who has alzheimers and attended adult day care).  She will benefit from skilled OT to increase safety and independence with bathroom transers/ADLs.    OT Assessment  Patient needs continued OT Services    Follow Up Recommendations  No OT follow up    Barriers to Discharge      Equipment Recommendations   (to be further assessed)    Recommendations for Other Services    Frequency  Min 2X/week    Precautions / Restrictions Precautions Precautions:  (anterior directTHA) Restrictions Weight Bearing Restrictions: No RLE Weight Bearing: Weight bearing as tolerated   Pertinent Vitals/Pain 6/10 with weight bearing R LE.  None in bed/chair  BP 98/57 after ambulating to chair (sitting)    ADL  Grooming: Set up Where Assessed - Grooming: Unsupported sitting Upper Body Bathing: Set up Where Assessed - Upper Body Bathing: Unsupported sitting Lower Body Bathing: Minimal assistance Where Assessed - Lower Body Bathing: Supported sit to stand Upper Body Dressing: Set up Where Assessed - Upper Body Dressing: Unsupported sitting Lower Body Dressing: Moderate assistance (used sock aid for L sock) Where Assessed - Lower Body Dressing: Supported sit to Pharmacist, hospital: [employer] engineer Method: Sit to stand (walked around bed to chair) Toileting - Clothing Manipulation and Hygiene: Minimal assistance Where Assessed - Toileting Clothing Manipulation and Hygiene: Sit to stand from 3-in-1 or toilet Transfers/Ambulation Related to ADLs: ambulated around bed to chair:  pt felt nauseas and  lightheaded.  Sitting BP 98/57 ADL Comments: Educated on AE.  Pt will have help for a couple of weeks:  may not need it.  Pt unsteady standing:  min A for balance support and cues for foot placement and to bear weight    OT Diagnosis: Generalized weakness  OT Problem List: Decreased activity tolerance;Decreased knowledge of use of DME or AE;Decreased safety awareness;Pain OT Treatment Interventions: Self-care/ADL training;Patient/family education;DME and/or AE instruction   OT Goals Acute Rehab OT Goals OT Goal Formulation: With patient Time For Goal Achievement: 08/06/12 Potential to Achieve Goals: Good ADL Goals Pt Will Transfer to Toilet: with supervision;Ambulation;Comfort height toilet (and complete all aspects of toileting with supervision) ADL Goal: Toilet Transfer - Progress: Goal set today Pt Will Perform Tub/Shower Transfer: with min assist;Ambulation;Shower transfer (min guard) ADL Goal: Web designer - Progress: Goal set today Miscellaneous OT Goals Miscellaneous OT Goal #5: Pt will verbalize vs demonstrated independently use of AE for adls OT Goal: Miscellaneous Goal #5 - Progress: Goal set today  Visit Information  Last OT Received On: 07/30/12 Assistance Needed: +1    Subjective Data  Subjective: I have friends coming from [place] and [place] to help for 2 weeks.  My son lives with [place] but he works PT Patient Stated Goal: resume independent lifestyle   Prior Functioning     Home Living Lives With: Spouse Bathroom Shower/Tub: Walk-in shower (upstairs) Bathroom Toilet: Handicapped height Prior Function Level of Independence: Independent          Vision/Perception     Cognition  Cognition Arousal/Alertness: Awake/alert Behavior During Therapy: WFL for tasks assessed/performed Overall Cognitive Status: Within Functional Limits for tasks assessed Needed to repeat  cues a couple of times, likely medication   Extremity/Trunk Assessment Right Upper Extremity  Assessment RUE ROM/Strength/Tone: WFL for tasks assessed Left Upper Extremity Assessment LUE ROM/Strength/Tone: WFL for tasks assessed     Mobility Bed Mobility Supine to Sit: 4: Min assist;With rails;HOB elevated Details for Bed Mobility Assistance: cues for technique Transfers Sit to Stand: 4: Min assist;From bed;With upper extremity assist;From chair/3-in-1 Details for Transfer Assistance: cues for arm and leg placement     Exercise     Balance     End of Session OT - End of Session Activity Tolerance:  (limited by nausea/lightheadedness) Patient left: in chair;with call bell/phone within reach Nurse Communication:  (nausea)  GO     [name] 07/30/2012, 9:25 AM Marica Otter, OTR/L [phone] 07/30/2012

## 2012-07-30 NOTE — Progress Notes (Signed)
Physical Therapy Treatment Patient Details Name: Autumn Lindsey MRN: [id number] DOB: 10-27-1950 Today's Date: 07/30/2012 Time: 8657-8469 PT Time Calculation (min): 15 min  PT Assessment / Plan / Recommendation Comments on Treatment Session  Pt progressing well with mobility.  Still has some dizziness with amb, however able to control it more with slow deep breathing.     Follow Up Recommendations  Home health PT;Supervision/Assistance - 24 hour     Does the patient have the potential to tolerate intense rehabilitation     Barriers to Discharge        Equipment Recommendations  None recommended by PT    Recommendations for Other Services    Frequency 7X/week   Plan Discharge plan remains appropriate    Precautions / Restrictions Precautions Precautions: None Restrictions Weight Bearing Restrictions: No RLE Weight Bearing: Weight bearing as tolerated   Pertinent Vitals/Pain 4/10 pain, ice pack applied    Mobility  Bed Mobility Bed Mobility: Not assessed (Pt in recliner when PT arrived. ) Supine to Sit: 4: Min assist;With rails;HOB elevated Details for Bed Mobility Assistance: cues for technique Transfers Transfers: Sit to Stand;Stand to Sit Sit to Stand: 4: Min assist;With upper extremity assist;With armrests;From chair/3-in-1 Stand to Sit: 4: Min assist;With upper extremity assist;With armrests;To chair/3-in-1 Details for Transfer Assistance: Assist to rise and steady with cues for hand placement, LE management and to maintain RLE on floor with standing to increase WB. Performed x 2 in order to use restroom.  Ambulation/Gait Ambulation/Gait Assistance: 4: Min assist Ambulation Distance (Feet): 120 Feet Assistive device: Rolling walker Ambulation/Gait Assistance Details: Cues for sequencing/technique with RW, upright posture, deep breathing (to assist with some intermittent dizziness) and to increase WB throughout RLE (esp more heel to toe contact) Gait Pattern:  Step-to pattern Gait velocity: decreased    Exercises     PT Diagnosis:    PT Problem List:   PT Treatment Interventions:     PT Goals Acute Rehab PT Goals PT Goal Formulation: With patient Time For Goal Achievement: 08/05/12 Potential to Achieve Goals: Good Pt will go Sit to Stand: with modified independence PT Goal: Sit to Stand - Progress: Progressing toward goal Pt will go Stand to Sit: with modified independence PT Goal: Stand to Sit - Progress: Progressing toward goal Pt will Ambulate: 51 - 150 feet;with supervision;with rolling walker PT Goal: Ambulate - Progress: Progressing toward goal  Visit Information  Last PT Received On: 07/30/12 Assistance Needed: +1    Subjective Data  Subjective: It feels better to be walking than sitting.  Patient Stated Goal: TO head home   Cognition  Cognition Arousal/Alertness: Awake/alert Behavior During Therapy: WFL for tasks assessed/performed Overall Cognitive Status: Within Functional Limits for tasks assessed    Balance     End of Session PT - End of Session Activity Tolerance: Patient tolerated treatment well (some dizziness) Patient left: in chair;with call bell/phone within reach Nurse Communication: Mobility status   GP     Vista Deck 07/30/2012, 10:04 AM

## 2012-07-30 NOTE — Progress Notes (Signed)
Physical Therapy Treatment Patient Details Name: Autumn Lindsey MRN: [id number] DOB: [birth year] Today's Date: 07/30/2012 Time: 4098-1191 PT Time Calculation (min): 23 min  PT Assessment / Plan / Recommendation Comments on Treatment Session  Pt continues to progress well with mobilty and exercises.     Follow Up Recommendations  Home health PT;Supervision/Assistance - 24 hour     Does the patient have the potential to tolerate intense rehabilitation     Barriers to Discharge        Equipment Recommendations  None recommended by PT    Recommendations for Other Services    Frequency 7X/week   Plan Discharge plan remains appropriate    Precautions / Restrictions Precautions Precautions: None Restrictions Weight Bearing Restrictions: No RLE Weight Bearing: Weight bearing as tolerated   Pertinent Vitals/Pain 2/10, ice packs applied    Mobility  Bed Mobility Bed Mobility: Not assessed Transfers Transfers: Sit to Stand;Stand to Sit Sit to Stand: 4: Min guard;With upper extremity assist;With armrests;From chair/3-in-1 Stand to Sit: 4: Min guard;With armrests;With upper extremity assist;To chair/3-in-1 Details for Transfer Assistance: Min/guard for safety with min cues for hand placement.  Ambulation/Gait Ambulation/Gait Assistance: 4: Min guard Ambulation Distance (Feet): 150 Feet Assistive device: Rolling walker Ambulation/Gait Assistance Details: Cues for sequencing/technique with RW, maintaining upright posture and continued deep breathing as she continues to have slight dizziness when ambulating.  Gait Pattern: Step-to pattern Gait velocity: decreased    Exercises Total Joint Exercises Ankle Circles/Pumps: AROM;Both;20 reps Quad Sets: AROM;Right;15 reps Short Arc Quad: AROM;Right;10 reps Heel Slides: AAROM;Right;10 reps Hip ABduction/ADduction: AAROM;Right;10 reps   PT Diagnosis:    PT Problem List:   PT Treatment Interventions:     PT Goals Acute Rehab PT  Goals PT Goal Formulation: With patient Time For Goal Achievement: 08/05/12 Potential to Achieve Goals: Good Pt will go Sit to Stand: with modified independence PT Goal: Sit to Stand - Progress: Progressing toward goal Pt will go Stand to Sit: with modified independence PT Goal: Stand to Sit - Progress: Progressing toward goal Pt will Ambulate: 51 - 150 feet;with supervision;with rolling walker PT Goal: Ambulate - Progress: Progressing toward goal Pt will Perform Home Exercise Program: with supervision, verbal cues required/provided PT Goal: Perform Home Exercise Program - Progress: Progressing toward goal  Visit Information  Last PT Received On: 07/30/12 Assistance Needed: +1    Subjective Data  Subjective: It hurts more getting up/down.  Patient Stated Goal: TO head home   Cognition  Cognition Arousal/Alertness: Awake/alert Behavior During Therapy: WFL for tasks assessed/performed Overall Cognitive Status: Within Functional Limits for tasks assessed    Balance     End of Session PT - End of Session Activity Tolerance: Patient tolerated treatment well Patient left: in chair;with call bell/phone within reach Nurse Communication: Mobility status   GP     Vista Deck 07/30/2012, 3:30 PM

## 2012-07-30 NOTE — Care Management Note (Signed)
    Page 1 of 1   07/30/2012     3:43:52 PM   CARE MANAGEMENT NOTE 07/30/2012  Patient:  Autumn Lindsey, Autumn A.   Account Number:  [id number]  Date Initiated:  07/30/2012  Documentation initiated by:  [facility]  Subjective/Objective Assessment:   ADMITTED W/DEGENERATIVE ARTHRITIS OF R HIP.     Action/Plan:   FROM HOME W/SPOUSE.HAS CANE,RW.   Anticipated DC Date:  08/02/2012   Anticipated DC Plan:  HOME W HOME HEALTH SERVICES      DC Planning Services  CM consult      Choice offered to / List presented to:  C-1 Patient           Status of service:  In process, will continue to follow Medicare Important Message given?   (If response is "NO", the following Medicare IM given date fields will be blank) Date Medicare IM given:   Date Additional Medicare IM given:    Discharge Disposition:    Per UR Regulation:    If discussed at Long Length of Stay Meetings, dates discussed:    Comments:  07/30/12 Curry Seefeldt RN,BSN NCM WEEKEND [phone] POD#1 R THA.[facility] FOR HH.PT.HH.TC [facility] TEL#[phone] AWARE OF REFERRAL & FOLLOWING IN EPIC BY JAMIE.AWAITING FINAL HH ORDERS.

## 2012-07-30 NOTE — Progress Notes (Signed)
Subjective: 1 Day Post-Op Procedure(s) (LRB): RIGHT TOTAL HIP ARTHROPLASTY ANTERIOR APPROACH (Right) Patient reports pain as moderate.  Some dizziness with first getting up. Out walking hallway earlier today.   Objective: Vital signs in last 24 hours: Temp:  [97.6 F (36.4 C)-99 F (37.2 C)] 99 F (37.2 C) (05/24 0521) Pulse Rate:  [57-87] 71 (05/24 0521) Resp:  [12-16] 16 (05/23 2031) BP: (90-100)/(48-65) 100/61 mmHg (05/24 0521) SpO2:  [97 %-100 %] 97 % (05/24 0521) Weight:  [66.282 kg (146 lb 2 oz)] 66.282 kg (146 lb 2 oz) (05/24 0222)  Intake/Output from previous day: 05/23 0701 - 05/24 0700 In: 4885 [P.O.:120; I.V.:4765] Out: 2725 [Urine:2225; Blood:500] Intake/Output this shift: Total I/O In: 240 [P.O.:240] Out: -    Recent Labs  07/30/12 0425  HGB 8.5*    Recent Labs  07/30/12 0425  WBC 4.8  RBC 2.83*  HCT 26.6*  PLT 169    Recent Labs  07/30/12 0425  NA 135  K 3.8  CL 102  CO2 30  BUN 12  CREATININE 0.67  GLUCOSE 120*  CALCIUM 8.0*   No results found for this basename: LABPT, INR,  in the last 72 hours  Right lower extremity: Sensation intact distally Intact pulses distally Dorsiflexion/Plantar flexion intact Incision: dressing C/D/I Compartment soft  Assessment/Plan: 1 Day Post-Op Procedure(s) (LRB): RIGHT TOTAL HIP ARTHROPLASTY ANTERIOR APPROACH (Right) Up with therapy Monitor symptoms of anemia and HGB ABLA secondary to surgery, patient on iron replacement.  Richardean Canal 07/30/2012, 10:53 AM

## 2012-07-31 LAB — CBC
HCT: 24.4 % — ABNORMAL LOW (ref 36.0–46.0)
Hemoglobin: 8 g/dL — ABNORMAL LOW (ref 12.0–15.0)
MCH: 30.4 pg (ref 26.0–34.0)
MCV: 92.8 fL (ref 78.0–100.0)
RBC: 2.63 MIL/uL — ABNORMAL LOW (ref 3.87–5.11)
WBC: 7.4 10*3/uL (ref 4.0–10.5)

## 2012-07-31 MED ORDER — PANTOPRAZOLE SODIUM 40 MG PO TBEC
40.0000 mg | DELAYED_RELEASE_TABLET | Freq: Every day | ORAL | Status: DC
  Administered 2012-07-31 – 2012-08-01 (×2): 40 mg via ORAL
  Filled 2012-07-31 (×2): qty 1

## 2012-07-31 NOTE — Progress Notes (Signed)
Physical Therapy Treatment Patient Details Name: Autumn Lindsey MRN: [id number] DOB: 1951-02-12 Today's Date: 07/31/2012 Time: 4098-1191 PT Time Calculation (min): 25 min  PT Assessment / Plan / Recommendation Comments on Treatment Session  Pt continues to progress with mobility, however did have some nausea/dizziness this morning.  MD aware and has spoken with pt.     Follow Up Recommendations  Home health PT;Supervision/Assistance - 24 hour     Does the patient have the potential to tolerate intense rehabilitation     Barriers to Discharge        Equipment Recommendations  None recommended by PT    Recommendations for Other Services    Frequency 7X/week   Plan Discharge plan remains appropriate    Precautions / Restrictions Precautions Precautions: None Restrictions Weight Bearing Restrictions: No RLE Weight Bearing: Weight bearing as tolerated   Pertinent Vitals/Pain 2/10    Mobility  Transfers Transfers: Sit to Stand;Stand to Sit Sit to Stand: 5: Supervision;From elevated surface;With upper extremity assist;From chair/3-in-1 Stand to Sit: 5: Supervision;With upper extremity assist;With armrests;To chair/3-in-1 Details for Transfer Assistance: Very min cues for hand placement.  Ambulation/Gait Ambulation/Gait Assistance: 5: Supervision Ambulation Distance (Feet): 200 Feet Assistive device: Rolling walker Ambulation/Gait Assistance Details: Min cues for heel to toe contact with gait and upright posture.  Gait Pattern: Step-to pattern;Step-through pattern Gait velocity: decreased Stairs: Yes Stairs Assistance: 4: Min guard Stair Management Technique: No rails;Step to pattern;Backwards;Forwards;With walker Number of Stairs: 2 (x 2 reps)    Exercises Total Joint Exercises Ankle Circles/Pumps: AROM;Both;20 reps Quad Sets: AROM;Right;15 reps Short Arc Quad: AROM;Right;10 reps Heel Slides: AAROM;Right;10 reps Hip ABduction/ADduction: AAROM;Right;10 reps   PT  Diagnosis:    PT Problem List:   PT Treatment Interventions:     PT Goals Acute Rehab PT Goals PT Goal Formulation: With patient Time For Goal Achievement: 08/05/12 Potential to Achieve Goals: Good Pt will go Supine/Side to Sit: with modified independence Pt will go Sit to Stand: with modified independence PT Goal: Sit to Stand - Progress: Progressing toward goal Pt will go Stand to Sit: with modified independence PT Goal: Stand to Sit - Progress: Progressing toward goal Pt will Ambulate: 51 - 150 feet;with supervision;with rolling walker PT Goal: Ambulate - Progress: Met Pt will Go Up / Down Stairs: 3-5 stairs;with least restrictive assistive device;with min assist PT Goal: Up/Down Stairs - Progress: Partly met Pt will Perform Home Exercise Program: with supervision, verbal cues required/provided PT Goal: Perform Home Exercise Program - Progress: Progressing toward goal  Visit Information  Last PT Received On: 07/31/12 Assistance Needed: +1    Subjective Data  Subjective: I'm ready to do the stairs Patient Stated Goal: TO head home   Cognition  Cognition Arousal/Alertness: Awake/alert Behavior During Therapy: WFL for tasks assessed/performed Overall Cognitive Status: Within Functional Limits for tasks assessed    Balance     End of Session PT - End of Session Activity Tolerance: Patient tolerated treatment well (limited by nausea) Patient left: in chair;with call bell/phone within reach;with family/visitor present Nurse Communication: Mobility status   GP     Vista Deck 07/31/2012, 5:05 PM

## 2012-07-31 NOTE — Progress Notes (Signed)
Subjective: 2 Days Post-Op Procedure(s) (LRB): RIGHT TOTAL HIP ARTHROPLASTY ANTERIOR APPROACH (Right) Patient reports pain as mild.  A little light-headed up with PT.  Objective: Vital signs in last 24 hours: Temp:  [99.4 F (37.4 C)-100.6 F (38.1 C)] 100 F (37.8 C) (05/25 0540) Pulse Rate:  [81-94] 88 (05/25 0540) Resp:  [16] 16 (05/25 0540) BP: (95-103)/(49-59) 95/59 mmHg (05/25 0540) SpO2:  [91 %-100 %] 91 % (05/25 0540)  Intake/Output from previous day: 05/24 0701 - 05/25 0700 In: 1100 [P.O.:1100] Out: -  Intake/Output this shift:     Recent Labs  07/30/12 0425 07/31/12 0535  HGB 8.5* 8.0*    Recent Labs  07/30/12 0425 07/31/12 0535  WBC 4.8 7.4  RBC 2.83* 2.63*  HCT 26.6* 24.4*  PLT 169 147*    Recent Labs  07/30/12 0425  NA 135  K 3.8  CL 102  CO2 30  BUN 12  CREATININE 0.67  GLUCOSE 120*  CALCIUM 8.0*   No results found for this basename: LABPT, INR,  in the last 72 hours  Sensation intact distally Intact pulses distally Dorsiflexion/Plantar flexion intact Incision: dressing C/D/I  Assessment/Plan: 2 Days Post-Op Procedure(s) (LRB): RIGHT TOTAL HIP ARTHROPLASTY ANTERIOR APPROACH (Right) Up with therapy Plan for discharge tomorrow If hgb still low tomorrow and symptomatic, will transfuse one unit prior to discharge.  Autumn Lindsey 07/31/2012, 10:24 AM

## 2012-07-31 NOTE — Progress Notes (Signed)
Physical Therapy Treatment Patient Details Name: Autumn Lindsey MRN: [id number] DOB: [birth year] Today's Date: 07/31/2012 Time: 4098-1191 PT Time Calculation (min): 23 min  PT Assessment / Plan / Recommendation Comments on Treatment Session  Pt continues to progress with mobility, however did have some nausea/dizziness this morning.  MD aware and has spoken with pt.     Follow Up Recommendations  Home health PT;Supervision/Assistance - 24 hour     Does the patient have the potential to tolerate intense rehabilitation     Barriers to Discharge        Equipment Recommendations  None recommended by PT    Recommendations for Other Services    Frequency 7X/week   Plan Discharge plan remains appropriate    Precautions / Restrictions Precautions Precautions: None Restrictions Weight Bearing Restrictions: No RLE Weight Bearing: Weight bearing as tolerated   Pertinent Vitals/Pain 2/10 pain    Mobility  Bed Mobility Bed Mobility: Supine to Sit Supine to Sit: 5: Supervision;HOB flat Details for Bed Mobility Assistance: Cues for technique and using LLE to assist RLE.   Transfers Transfers: Sit to Stand;Stand to Sit Sit to Stand: 5: Supervision;From elevated surface;With upper extremity assist;From chair/3-in-1;From bed Stand to Sit: 5: Supervision;With upper extremity assist;With armrests;To chair/3-in-1 Details for Transfer Assistance: Performed x 2 in order to use 3in1 in restroom with cues for hand placement and LE management.  Ambulation/Gait Ambulation/Gait Assistance: 4: Min guard Ambulation Distance (Feet): 65 Feet (and another 12') Assistive device: Rolling walker Ambulation/Gait Assistance Details: Min cues for upright posture and maintaining R LE in straight alignment with gait.  Had 2 episodes of feeling dizzy then 1 with feeling nauseated.  Chair was brought to pt so that she could sit. She states that she had pain medicine on empty stomach this morning.  Gait  Pattern: Step-to pattern Gait velocity: decreased Stairs: No    Exercises     PT Diagnosis:    PT Problem List:   PT Treatment Interventions:     PT Goals Acute Rehab PT Goals PT Goal Formulation: With patient Time For Goal Achievement: 08/05/12 Potential to Achieve Goals: Good Pt will go Supine/Side to Sit: with modified independence PT Goal: Supine/Side to Sit - Progress: Progressing toward goal Pt will go Sit to Stand: with modified independence PT Goal: Sit to Stand - Progress: Progressing toward goal Pt will go Stand to Sit: with modified independence PT Goal: Stand to Sit - Progress: Progressing toward goal Pt will Ambulate: 51 - 150 feet;with supervision;with rolling walker PT Goal: Ambulate - Progress: Progressing toward goal  Visit Information  Last PT Received On: 07/31/12 Assistance Needed: +1    Subjective Data  Subjective: I feel a little nauseated (while up ambulating) Patient Stated Goal: TO head home   Cognition  Cognition Arousal/Alertness: Awake/alert Behavior During Therapy: WFL for tasks assessed/performed Overall Cognitive Status: Within Functional Limits for tasks assessed    Balance     End of Session PT - End of Session Activity Tolerance: Other (comment) (limited by nausea) Patient left: in chair;with call bell/phone within reach Nurse Communication: Mobility status   GP     Vista Deck 07/31/2012, 11:48 AM

## 2012-08-01 LAB — CBC
HCT: 22.2 % — ABNORMAL LOW (ref 36.0–46.0)
Hemoglobin: 7.4 g/dL — ABNORMAL LOW (ref 12.0–15.0)
MCH: 30.7 pg (ref 26.0–34.0)
MCHC: 33.3 g/dL (ref 30.0–36.0)
MCV: 92.1 fL (ref 78.0–100.0)
RDW: 12.2 % (ref 11.5–15.5)

## 2012-08-01 MED ORDER — METHOCARBAMOL 500 MG PO TABS: 500.0000 mg | ORAL_TABLET | Freq: Four times a day (QID) | ORAL | Status: DC | PRN

## 2012-08-01 MED ORDER — HYDROCODONE-ACETAMINOPHEN 5-325 MG PO TABS: 1.0000 | ORAL_TABLET | Freq: Four times a day (QID) | ORAL | Status: DC | PRN

## 2012-08-01 MED ORDER — FERROUS SULFATE 325 (65 FE) MG PO TABS: 325.0000 mg | ORAL_TABLET | Freq: Three times a day (TID) | ORAL | Status: DC

## 2012-08-01 MED ORDER — ASPIRIN 325 MG PO TBEC: 325.0000 mg | DELAYED_RELEASE_TABLET | Freq: Two times a day (BID) | ORAL | Status: DC

## 2012-08-01 NOTE — Progress Notes (Signed)
Subjective: 3 Days Post-Op Procedure(s) (LRB): RIGHT TOTAL HIP ARTHROPLASTY ANTERIOR APPROACH (Right) Patient reports pain as mild.  Asymptomatic acute blood loss anemia.  Working well with therapy.  Objective: Vital signs in last 24 hours: Temp:  [98.5 F (36.9 C)-99.7 F (37.6 C)] 98.5 F (36.9 C) (05/26 0500) Pulse Rate:  [73-86] 73 (05/26 0500) Resp:  [16] 16 (05/26 0500) BP: (94-103)/(60-62) 94/60 mmHg (05/26 0500) SpO2:  [93 %-96 %] 94 % (05/26 0500)  Intake/Output from previous day: 05/25 0701 - 05/26 0700 In: 240 [P.O.:240] Out: -  Intake/Output this shift: Total I/O In: 240 [P.O.:240] Out: -    Recent Labs  07/30/12 0425 07/31/12 0535 08/01/12 0405  HGB 8.5* 8.0* 7.4*    Recent Labs  07/31/12 0535 08/01/12 0405  WBC 7.4 5.8  RBC 2.63* 2.41*  HCT 24.4* 22.2*  PLT 147* 137*    Recent Labs  07/30/12 0425  NA 135  K 3.8  CL 102  CO2 30  BUN 12  CREATININE 0.67  GLUCOSE 120*  CALCIUM 8.0*   No results found for this basename: LABPT, INR,  in the last 72 hours  Sensation intact distally Intact pulses distally Dorsiflexion/Plantar flexion intact Incision: dressing C/D/I Compartment soft  Assessment/Plan: 3 Days Post-Op Procedure(s) (LRB): RIGHT TOTAL HIP ARTHROPLASTY ANTERIOR APPROACH (Right) Discharge home with home health  Autumn Lindsey Y 08/01/2012, 10:03 AM

## 2012-08-01 NOTE — Progress Notes (Signed)
Physical Therapy Treatment Patient Details Name: Autumn Lindsey MRN: [id number] DOB: 1950-06-25 Today's Date: 08/01/2012 Time: 4098-1191 PT Time Calculation (min): 23 min  PT Assessment / Plan / Recommendation Comments on Treatment Session  Pt progressing well with all mobility and stairs.  she is ready for D/C.     Follow Up Recommendations  Home health PT;Supervision/Assistance - 24 hour     Does the patient have the potential to tolerate intense rehabilitation     Barriers to Discharge        Equipment Recommendations  None recommended by PT    Recommendations for Other Services    Frequency 7X/week   Plan Discharge plan remains appropriate    Precautions / Restrictions Precautions Precautions: None Restrictions Weight Bearing Restrictions: No RLE Weight Bearing: Weight bearing as tolerated   Pertinent Vitals/Pain No pain    Mobility  Bed Mobility Bed Mobility: Supine to Sit Supine to Sit: HOB flat;6: Modified independent (Device/Increase time) Details for Bed Mobility Assistance: increased time Transfers Transfers: Sit to Stand;Stand to Sit Sit to Stand: 5: Supervision;From elevated surface;With upper extremity assist;From chair/3-in-1 Stand to Sit: 5: Supervision;With upper extremity assist;With armrests;To chair/3-in-1 Details for Transfer Assistance: Very min cues for hand placement. Practiced again in restroom without 3in1 to ensure that pt safe to perform at home.  She was able to demonstrate without 3in1.  Ambulation/Gait Ambulation/Gait Assistance: 5: Supervision Ambulation Distance (Feet): 250 Feet Assistive device: Rolling walker Ambulation/Gait Assistance Details: Min cues for less circumducted gait pattern on RLE.  Gait Pattern: Step-to pattern;Step-through pattern Gait velocity: decreased Stairs: Yes Stairs Assistance: 4: Min guard Stair Management Technique: No rails;Step to pattern;Backwards;Forwards;With walker Number of Stairs: 2     Exercises     PT Diagnosis:    PT Problem List:   PT Treatment Interventions:     PT Goals Acute Rehab PT Goals PT Goal Formulation: With patient Time For Goal Achievement: 08/05/12 Potential to Achieve Goals: Good Pt will go Supine/Side to Sit: with modified independence PT Goal: Supine/Side to Sit - Progress: Met Pt will go Sit to Stand: with modified independence PT Goal: Sit to Stand - Progress: Progressing toward goal Pt will go Stand to Sit: with modified independence PT Goal: Stand to Sit - Progress: Progressing toward goal Pt will Ambulate: 51 - 150 feet;with supervision;with rolling walker PT Goal: Ambulate - Progress: Met Pt will Go Up / Down Stairs: 3-5 stairs;with least restrictive assistive device;with min assist PT Goal: Up/Down Stairs - Progress: Met Pt will Perform Home Exercise Program: with supervision, verbal cues required/provided PT Goal: Perform Home Exercise Program - Progress: Met  Visit Information  Last PT Received On: 08/01/12 Assistance Needed: +1    Subjective Data  Subjective: I'm ready to do the stairs Patient Stated Goal: TO head home   Cognition  Cognition Arousal/Alertness: Awake/alert Behavior During Therapy: WFL for tasks assessed/performed Overall Cognitive Status: Within Functional Limits for tasks assessed    Balance     End of Session PT - End of Session Activity Tolerance: Patient tolerated treatment well (limited by nausea) Patient left: in chair;with call bell/phone within reach;with family/visitor present Nurse Communication: Mobility status   GP     Vista Deck 08/01/2012, 11:26 AM

## 2012-08-01 NOTE — Progress Notes (Signed)
Pt home with HHPT with [facility]. Referral called to in house rep.

## 2012-08-01 NOTE — Discharge Summary (Signed)
Patient ID: Autumn Lindsey MRN: [id number] DOB/AGE: 61-Nov-1952 61 y.o.  Admit date: 07/29/2012 Discharge date: 08/01/2012  Admission Diagnoses:  Principal Problem:   Degenerative arthritis of hip   Discharge Diagnoses:  Same  Past Medical History  Diagnosis Date  . PONV (postoperative nausea and vomiting)   . Headache     in past  . Arthritis   . History of melanoma excision   . GERD (gastroesophageal reflux disease)   . Anemia   . Depression     Surgeries: Procedure(s): RIGHT TOTAL HIP ARTHROPLASTY ANTERIOR APPROACH on 07/29/2012   Consultants:    Discharged Condition: Improved  Hospital Course: Mahina Salatino is an 62 y.o. female who was admitted 07/29/2012 for operative treatment ofDegenerative arthritis of hip. Patient has severe unremitting pain that affects sleep, daily activities, and work/hobbies. After pre-op clearance the patient was taken to the operating room on 07/29/2012 and underwent  Procedure(s): RIGHT TOTAL HIP ARTHROPLASTY ANTERIOR APPROACH.    Patient was given perioperative antibiotics: Anti-infectives   Start     Dose/Rate Route Frequency Ordered Stop   07/29/12 1400  ceFAZolin (ANCEF) IVPB 1 g/50 mL premix     1 g 100 mL/hr over 30 Minutes Intravenous Every 6 hours 07/29/12 1147 07/29/12 2042   07/29/12 0511  ceFAZolin (ANCEF) IVPB 2 g/50 mL premix     2 g 100 mL/hr over 30 Minutes Intravenous On call to O.R. 07/29/12 4098 07/29/12 0735       Patient was given sequential compression devices, early ambulation, and chemoprophylaxis to prevent DVT.  Patient benefited maximally from hospital stay and there were no complications.    Recent vital signs: Patient Vitals for the past 24 hrs:  BP Temp Temp src Pulse Resp SpO2  08/01/12 0500 94/60 mmHg 98.5 F (36.9 C) Oral 73 16 94 %  07/31/12 2030 103/62 mmHg 99 F (37.2 C) Oral 74 16 96 %  07/31/12 1430 100/61 mmHg 99.7 F (37.6 C) Oral 86 16 93 %     Recent laboratory studies:  Recent  Labs  07/30/12 0425 07/31/12 0535 08/01/12 0405  WBC 4.8 7.4 5.8  HGB 8.5* 8.0* 7.4*  HCT 26.6* 24.4* 22.2*  PLT 169 147* 137*  NA 135  --   --   K 3.8  --   --   CL 102  --   --   CO2 30  --   --   BUN 12  --   --   CREATININE 0.67  --   --   GLUCOSE 120*  --   --   CALCIUM 8.0*  --   --      Discharge Medications:     Medication List    STOP taking these medications       naproxen sodium 220 MG tablet  Commonly known as:  ANAPROX      TAKE these medications       aspirin 325 MG EC tablet  Take 1 tablet (325 mg total) by mouth 2 (two) times daily.     CALCIUM 600 + D PO  Take 1 tablet by mouth daily.     citalopram 20 MG tablet  Commonly known as:  CELEXA  Take 20 mg by mouth every evening.     estradiol-norethindrone 1-0.5 MG per tablet  Commonly known as:  ACTIVELLA  Take 1 tablet by mouth daily.     ferrous sulfate 325 (65 FE) MG tablet  Take 1 tablet (325 mg total) by  mouth 3 (three) times daily after meals.     fish oil-omega-3 fatty acids 1000 MG capsule  Take 1 g by mouth 2 (two) times daily.     HYDROcodone-acetaminophen 5-325 MG per tablet  Commonly known as:  NORCO  Take 1-2 tablets by mouth every 6 (six) hours as needed for pain.     methocarbamol 500 MG tablet  Commonly known as:  ROBAXIN  Take 1 tablet (500 mg total) by mouth every 6 (six) hours as needed.     multivitamin with minerals Tabs  Take 1 tablet by mouth daily.     PRESERVISION AREDS 2 Caps  Take 1 capsule by mouth 2 (two) times daily.        Diagnostic Studies: Dg Hip Complete Right  07/29/2012   *RADIOLOGY REPORT*  Clinical Data: Intraop right total hip arthroplasty  RIGHT HIP - COMPLETE 2+ VIEW  Fluoroscopy time:  33 seconds  Comparison: None.  Findings: Two fluoroscopic spot images were obtained during right total hip arthroplasty.  The surgical hardware is in satisfactory position.  IMPRESSION: Intraoperative fluoroscopic spot images during right total hip  arthroplasty.   Original Report Authenticated By: Charline Bills, M.D.   Dg Pelvis Portable  07/29/2012   *RADIOLOGY REPORT*  Clinical Data: Post right hip replacement  PORTABLE PELVIS  Comparison: Portable exam 0958 hours without priors for comparison  Findings: Acetabular and femoral components of a right hip prosthesis are identified. Tip of femoral component excluded. Diffuse osseous demineralization. No fracture or dislocation. Expected postsurgical soft tissue changes laterally at the right hip. Numerous pelvic phleboliths. Left hip joint space and SI joints unremarkable.  IMPRESSION: Right hip prosthesis without acute complication. Osseous demineralization.   Original Report Authenticated By: Ulyses Southward, M.D.   Dg Hip Portable 1 View Right  07/29/2012   *RADIOLOGY REPORT*  Clinical Data: Right total hip arthroplasty  PORTABLE RIGHT HIP - 1 VIEW  Comparison: None.  Findings: Intraoperative fluoroscopic images dated 07/29/2012.  Right total hip arthroplasty in satisfactory position on this lateral view.  No fracture or dislocation is seen.  IMPRESSION: Right total hip arthroplasty in satisfactory position.   Original Report Authenticated By: Charline Bills, M.D.   Dg C-arm 1-60 Min-no Report  07/29/2012   CLINICAL DATA: intra op   C-ARM 1-60 MINUTES  Fluoroscopy was utilized by the requesting physician.  No radiographic  interpretation.     Disposition:  To home      Discharge Orders   Future Orders Complete By Expires     Call MD / Call 911  As directed     Comments:      If you experience chest pain or shortness of breath, CALL 911 and be transported to the hospital emergency room.  If you develope a fever above 101 F, pus (white drainage) or increased drainage or redness at the wound, or calf pain, call your surgeon's office.    Constipation Prevention  As directed     Comments:      Drink plenty of fluids.  Prune juice may be helpful.  You may use a stool softener, such as Colace  (over the counter) 100 mg twice a day.  Use MiraLax (over the counter) for constipation as needed.    Diet - low sodium heart healthy  As directed     Discharge patient  As directed     Increase activity slowly as tolerated  As directed        Follow-up Information   Follow  up with Kathryne Hitch, MD In 2 weeks.   Contact information:   [address] [place] [address] [phone]        Signed: Kathryne Hitch 08/01/2012, 10:07 AM

## 2012-08-02 ENCOUNTER — Encounter (HOSPITAL_COMMUNITY): Payer: Self-pay | Admitting: Orthopaedic Surgery

## 2012-08-13 DIAGNOSIS — M629 Disorder of muscle, unspecified: Secondary | ICD-10-CM | POA: Insufficient documentation

## 2012-08-13 DIAGNOSIS — M76899 Other specified enthesopathies of unspecified lower limb, excluding foot: Secondary | ICD-10-CM | POA: Insufficient documentation

## 2012-08-13 DIAGNOSIS — M2420 Disorder of ligament, unspecified site: Secondary | ICD-10-CM | POA: Insufficient documentation

## 2012-08-13 DIAGNOSIS — F411 Generalized anxiety disorder: Secondary | ICD-10-CM | POA: Insufficient documentation

## 2012-08-13 DIAGNOSIS — M2240 Chondromalacia patellae, unspecified knee: Secondary | ICD-10-CM | POA: Insufficient documentation

## 2012-11-23 DIAGNOSIS — H3530 Unspecified macular degeneration: Secondary | ICD-10-CM | POA: Insufficient documentation

## 2012-11-23 DIAGNOSIS — D649 Anemia, unspecified: Secondary | ICD-10-CM | POA: Insufficient documentation

## 2013-03-08 DIAGNOSIS — D72819 Decreased white blood cell count, unspecified: Secondary | ICD-10-CM | POA: Insufficient documentation

## 2014-06-14 ENCOUNTER — Encounter: Payer: Self-pay | Admitting: Physical Therapy

## 2014-06-14 ENCOUNTER — Ambulatory Visit: Payer: BC Managed Care – PPO | Attending: Orthopaedic Surgery | Admitting: Physical Therapy

## 2014-06-14 DIAGNOSIS — M5442 Lumbago with sciatica, left side: Secondary | ICD-10-CM | POA: Diagnosis present

## 2014-06-14 DIAGNOSIS — R262 Difficulty in walking, not elsewhere classified: Secondary | ICD-10-CM | POA: Diagnosis not present

## 2014-06-14 DIAGNOSIS — M2560 Stiffness of unspecified joint, not elsewhere classified: Secondary | ICD-10-CM

## 2014-06-14 NOTE — Patient Instructions (Signed)
Prone or standing extensions 10x every 2 hours; Flexion avoidance; Sit with lumbar roll; Stoplight system to avoid exacerbating symptoms

## 2014-06-15 NOTE — Therapy (Signed)
[place], [place], [address] Phone: [phone]   Fax:  [phone]  Physical Therapy Evaluation  Patient Details  Name: Autumn Lindsey MRN: [id number] Date of Birth: 04-28-1950 Referring Provider:  Mcarthur Rossetti*  Encounter Date: 06/14/2014      PT End of Session - 06/14/14 1807    Visit Number 1   Number of Visits 16   Date for PT Re-Evaluation 08/09/14   PT Start Time 1100   PT Stop Time 1150   PT Time Calculation (min) 50 min   Activity Tolerance Patient tolerated treatment well      Past Medical History  Diagnosis Date  . PONV (postoperative nausea and vomiting)   . Headache(784.0)     in past  . Arthritis   . History of melanoma excision   . GERD (gastroesophageal reflux disease)   . Anemia   . Depression     Past Surgical History  Procedure Laterality Date  . Appendectomy  1970's  . Tonsillectomy    . Skin graft  1980's  . Breast enhancement surgery    . 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  Visit Diagnosis:  Left-sided low back pain with left-sided sciatica - Plan: PT plan of care cert/re-cert  Joint stiffness of spine - Plan: PT plan of care cert/re-cert  Difficulty walking - Plan: PT plan of care cert/re-cert      Subjective Assessment - 06/14/14 1104    Subjective Went to the doctor for knee pain last summer and he did an X-ray.  Then buttock pain started and lower leg pain 6 weeks ago.  Has a husband with Alzheimers and has had to pick him up off the floor, thinks the cummulative effects have exacerbated.   Pertinent History Right THR 07/30/12    Limitations Walking  getting in out of [place]; stairs; diff sleep   How long can you sit comfortably? 1 hour    How long can you stand comfortably? > 15 min   How long can you walk comfortably? < 1/2 mile (previously 2-3 miles)   Diagnostic tests knee x-ray only   Patient Stated Goals decrease pain; get back to walking and usual activities   Currently in Pain? Yes   Pain Score 2   on prednisone   Pain Location Leg  left hip   Pain Orientation Left   Pain Type Acute pain   Pain Onset More than a month ago   Pain Frequency Intermittent   Aggravating Factors  walk; stairs; lifting   Pain Relieving Factors yoga; bend over to stretch; HS stretch with short term relief            [facility] PT Assessment - 06/14/14 1112    Assessment   Medical Diagnosis low back pain with left sided sciatica   Onset Date --  6 weeks   Next MD Visit 1 month   Prior Therapy before hip surgery   Precautions   Precautions None   Restrictions   Weight Bearing Restrictions No   Balance Screen   Has the patient fallen in the past 6 months No   Has the patient had a decrease in activity level because of a fear of falling?  No   Is the patient  reluctant to leave their home because of a fear of falling?  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   Leisure --  [place]; walking 2-3 miles, stairs for ex   Observation/Other Assessments   Focus on Therapeutic Outcomes (FOTO)  53% limit   Posture/Postural Control   Posture Comments decreased lumbar lordosis   ROM / Strength   AROM / PROM / Strength AROM;Strength   AROM   Overall AROM Comments --  no clear directional preference with repeated movement test    AROM Assessment Site Lumbar   Lumbar Flexion 65   Lumbar Extension 25   Lumbar - Right Side Bend 35   Lumbar - Left Side Bend 35   Strength   Strength Assessment Site Hip;Knee;Ankle;Lumbar   Right/Left Hip Left   Left Hip Flexion 5/5   Left Hip ABduction 5/5   Right/Left Knee Left   Left Knee Flexion 5/5   Left Knee Extension 5/5   Right/Left Ankle Left   Left Ankle Dorsiflexion 5/5   Lumbar Flexion 4/5   Lumbar Extension 4/5   Palpation   Palpation tender left gluteals/piriformis   Special Tests    Special Tests Lumbar   Slump test   Findings Negative   Side Left   Prone Knee Bend Test   Findings Positive   Side Left   Straight Leg Raise   Findings Positive   Side  Left   Comment --  increased pull on right LB but able to go 75 degrees                           PT Education - 06/14/14 1807    Education provided Yes   Education Details prone press ups, patient education; flexion avoidance   Person(s) Educated Patient   Methods Explanation;Demonstration;Handout   Comprehension Verbalized understanding;Returned demonstration          PT Short Term Goals - 06/15/14 0645    PT SHORT TERM GOAL #1   Title Patient will demonstrate and express a good understanding of basic postural correction, basic body mechanics modifications to decrease exacerbation of symptoms.   Time 4   Period Weeks   Status New   PT SHORT TERM GOAL #2   Title Symptoms will be centralized 50% of the time.   Time 4   Period Weeks   Status New   PT SHORT TERM GOAL #3   Title Patient will be able to walk 3/4 of a mile with min complaint of pain.   Time 4   Period Weeks   Status New           PT Long Term Goals - 06/15/14 3818    PT LONG TERM GOAL #1   Title Patient will be independent in safe self progression of home ex program and self management techniques.   Time 8   Period Weeks   Status New   PT LONG TERM GOAL #2   Title The patient will have full and  painless lumbar AROM needed for home and work tasks.     Time 8   Period Weeks   Status New   PT LONG TERM GOAL #3   Title The patient will be knowledgable in basic concepts of lifting and body mechanics in assisting her disabled husband.     Time 8   Period Weeks   Status New   PT LONG TERM GOAL #4   Title Symptoms will be centralized 75% of the time with daily activities.     Time 8   Period Weeks   Status New   PT LONG TERM GOAL #5   Title FOTO functional outcome score will improve to 38% indicating improved function with less pain.   Time 8   Period Weeks   Status New               Plan - 06/14/14 1808    Clinical Impression Statement The patient is a pleasant 64 year old with a history of left knee pain last summer but states about 6 weeks ago she had the onset of left LBP, lateral thigh pain and left lower leg pain.  She reports she had altered gait initially.  She is unable to determine a specific date or episode but suspects that lifting and caring for her husband with early onset Alzheimer's may be a contributing factor.  She reports her symptoms are worsened with walking and going up and down stairs and better with doing yoga stretches.  She states she has noticed a big improvement in her pain and symptoms since going on a steroid dose pack which she finishes tomorrow.  Her lumbar AROM is slightly limited:  flex 65, ext 25, right and left sidebend 35 degrees.  Extensive repeated movement testing performed with no clear directional preference.  Postive prone knee bend.  Negative slump test.  SLR mildly painful in low back but able to acheive 75 degrees.  Normal LE strength.  Decreased activation of transverse abdominals and lumbar multifidi.  Patient would benefit from PT to address these deficits.     Pt will benefit from skilled therapeutic intervention in order to improve on the following deficits Pain;Increased muscle spasms;Decreased range of motion;Decreased strength    Rehab Potential Good   Clinical Impairments Affecting Rehab Potential Right THR anterior approach 2014 with good outcome   PT Frequency 2x / week   PT Duration 8 weeks   PT Treatment/Interventions ADLs/Self Care Home Management;Electrical Stimulation;Moist Heat;Traction;Therapeutic exercise;Patient/family education;Dry needling;Manual techniques  ionto written on MD order   PT Next Visit Plan Assess response to prone lumbar extension and flexion avoidance, progress if centralizing; dry needle and/or myofascial as needed for left gluteals; traction if no clear directional preference; neural gliding         Problem List Patient Active Problem List   Diagnosis Date Noted  . Degenerative arthritis of hip 07/29/2012    Alvera Singh 06/15/2014, 6:58 AM  [facility] [address] [place], [place], [address] Phone: [phone]   Fax:  [phone]   Ruben Im, PT 06/15/2014 6:58 AM Phone: [phone] Fax: [phone]

## 2014-06-19 ENCOUNTER — Ambulatory Visit: Payer: BC Managed Care – PPO | Admitting: Physical Therapy

## 2014-06-20 ENCOUNTER — Ambulatory Visit: Payer: BC Managed Care – PPO | Admitting: Physical Therapy

## 2014-06-20 DIAGNOSIS — M5442 Lumbago with sciatica, left side: Secondary | ICD-10-CM | POA: Diagnosis not present

## 2014-06-20 DIAGNOSIS — M2560 Stiffness of unspecified joint, not elsewhere classified: Secondary | ICD-10-CM

## 2014-06-20 DIAGNOSIS — R262 Difficulty in walking, not elsewhere classified: Secondary | ICD-10-CM

## 2014-06-20 NOTE — Therapy (Addendum)
[place], [place], [address] Phone: [phone]   Fax:  [phone]  Physical Therapy Treatment  Patient Details  Name: Autumn Lindsey MRN: [id number] Date of Birth: March 02, 1951 Referring Provider:  Robyne Peers, MD  Encounter Date: 06/20/2014      PT End of Session - 06/20/14 0817    Visit Number 2   Number of Visits 16   Date for PT Re-Evaluation 08/09/14   PT Start Time 0804   PT Stop Time 0852   PT Time Calculation (min) 48 min      Past Medical History  Diagnosis Date  . PONV (postoperative nausea and vomiting)   . Headache(784.0)     in past  . Arthritis   . History of melanoma excision   . GERD (gastroesophageal reflux disease)   . Anemia   . Depression     Past Surgical History  Procedure Laterality Date  . Appendectomy  1970's  . Tonsillectomy    . Skin graft  1980's  . Breast enhancement surgery    . 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  Visit Diagnosis:  Left-sided low back pain with left-sided sciatica  Joint stiffness of spine  Difficulty walking      Subjective Assessment - 06/20/14 0809    Subjective Pt. states she finished her prednizone last saturday and began to walk with a lilted gait due to pain returning.  She stated that she isn't in pain at the moment, but its more of a discomfort in her low back.  The pt. reported that her pain isn't radiating as much, but it will appear in little spots down her leg.  She reported that she recived a DTM in her left gluteals and low back recently.  The pt. stated that she doesn't know how to correctly lift her husband while using proper body mechanics.     Currently in Pain? No/denies                        [facility] Adult PT Treatment/Exercise - 06/20/14 0859    Lumbar Exercises: Stretches   Passive Hamstring Stretch 2 reps;20 seconds  left and right   Press Ups 5 reps  5 seconds   Lumbar Exercises: Supine   Ab Set 10 reps;5 seconds   Clam 5 reps;1 second   Heel Slides 5 reps   Bent Knee Raise 5 reps;3 seconds  also scissors level 2 and 3; cues for tabletop position   Other Supine Lumbar Exercises neural flossing - ankle pumps during hamstring stretch 10 x each leg   Other Supine Lumbar Exercises Pt. demonstrated exercises she's doing at home including abdominal crunches and scissor kicks.  Pt was instructed to discontinue scissor kicks due to them causing her back to arch and possibly increase her pain.     Modalities   Modalities Moist Heat   Moist Heat Therapy   Number Minutes Moist Heat 15 Minutes   Moist Heat Location Other (comment)  lumbar supine                PT Education - 06/20/14 0837    Education provided Yes   Education Details lumbar stabilization and scissors HEP   Person(s) Educated Patient  Methods Handout;Explanation;Demonstration   Comprehension Verbalized understanding;Returned demonstration          PT Short Term Goals - 06/15/14 0645    PT SHORT TERM GOAL #1   Title Patient will demonstrate and express a good understanding of basic postural correction, basic body mechanics modifications to decrease exacerbation of symptoms.   Time 4   Period Weeks   Status New   PT SHORT TERM GOAL #2   Title Symptoms will be centralized 50% of the time.   Time 4   Period Weeks   Status New   PT SHORT TERM GOAL #3   Title Patient will be able to walk 3/4 of a mile with min complaint of pain.   Time 4   Period Weeks   Status New           PT Long Term Goals - 06/15/14 3016    PT LONG TERM GOAL #1   Title Patient will be independent in safe self progression of home ex program and self management techniques.   Time 8    Period Weeks   Status New   PT LONG TERM GOAL #2   Title The patient will have full and painless lumbar AROM needed for home and work tasks.     Time 8   Period Weeks   Status New   PT LONG TERM GOAL #3   Title The patient will be knowledgable in basic concepts of lifting and body mechanics in assisting her disabled husband.     Time 8   Period Weeks   Status New   PT LONG TERM GOAL #4   Title Symptoms will be centralized 75% of the time with daily activities.     Time 8   Period Weeks   Status New   PT LONG TERM GOAL #5   Title FOTO functional outcome score will improve to 38% indicating improved function with less pain.   Time 8   Period Weeks   Status New               Plan - 06/20/14 0109    Clinical Impression Statement The pt. reported no pain upon arrival, although she had a little discomfort in her low back. She has not noticed a directional preference yet but will continue to monitor movements.  She reported that her pain is not radiating in a line as much, but it will apear in different spots down her leg and around her knee.  She was able to progress to lumbar stabilization and pilates scissors.  She experienced a mild increase in pain with these (2/10).       PT Next Visit Plan dry needling and/or myofascial for left gluteals as needed; try 100's and teach proper technique, assess response to lumbar stabilization HEP, continue to assess directional preference, proper body mechanics        Problem List Patient Active Problem List   Diagnosis Date Noted  . Degenerative arthritis of hip 07/29/2012    Travarus Trudo,McKinnley, SPTA 06/20/2014, 9:18 AM  Hessie Diener, PTA 06/20/2014 9:22 AM Phone: [phone] Fax: [facility] [address] [place], [place], [address] Phone: [phone]   Fax:  [phone]

## 2014-06-20 NOTE — Patient Instructions (Signed)

## 2014-06-26 ENCOUNTER — Ambulatory Visit: Payer: BC Managed Care – PPO | Admitting: Physical Therapy

## 2014-06-26 DIAGNOSIS — R262 Difficulty in walking, not elsewhere classified: Secondary | ICD-10-CM

## 2014-06-26 DIAGNOSIS — M5442 Lumbago with sciatica, left side: Secondary | ICD-10-CM | POA: Diagnosis not present

## 2014-06-26 DIAGNOSIS — M2560 Stiffness of unspecified joint, not elsewhere classified: Secondary | ICD-10-CM

## 2014-06-26 NOTE — Patient Instructions (Signed)

## 2014-06-26 NOTE — Therapy (Signed)
[place], [place], [address] Phone: [phone]   Fax:  [phone]  Physical Therapy Treatment  Patient Details  Name: Autumn Lindsey MRN: [id number] Date of Birth: Apr 09, 1950 Referring Provider:  Robyne Peers, MD  Encounter Date: 06/26/2014      PT End of Session - 06/26/14 1417    Visit Number 3   Number of Visits 16   Date for PT Re-Evaluation 08/09/14   PT Start Time 0215   PT Stop Time 0315   PT Time Calculation (min) 60 min      Past Medical History  Diagnosis Date  . PONV (postoperative nausea and vomiting)   . Headache(784.0)     in past  . Arthritis   . History of melanoma excision   . GERD (gastroesophageal reflux disease)   . Anemia   . Depression     Past Surgical History  Procedure Laterality Date  . Appendectomy  1970's  . Tonsillectomy    . Skin graft  1980's  . Breast enhancement surgery    . 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  Visit Diagnosis:  Left-sided low back pain with left-sided sciatica  Joint stiffness of spine  Difficulty walking      Subjective Assessment - 06/26/14 1417    Subjective The pt. reports bending over and picking up sticks all last saturday and that made her have pain after she was finished.  She reports a 2/10 nagging type of pain today. She said she put a hot pack on her knee last night due to the radiating pain and said that it helped.  No increase in pain with exercises.     Currently in Pain? Yes   Pain Score 2    Pain Location Back            [facility] PT Assessment - 06/26/14 0001    Strength   Right Hip Flexion 4+/5   Right Hip Extension 4+/5   Right Hip ABduction 4+/5                     [facility]  Adult PT Treatment/Exercise - 06/26/14 1427    Lumbar Exercises: Supine   Bent Knee Raise 5 reps;3 seconds  scissors level 2 and 3; cues for tabletop position   Bridge 10 reps;3 seconds  cues for abdominal bracing   Bridge Limitations 5 reps with clams; 5 reps with alternating knee kick outs   Straight Leg Raise 10 reps;3 seconds  bilateral   Other Supine Lumbar Exercises 2 reps pilates 100's - instructed proper technique    Knee/Hip Exercises: Sidelying   Hip ABduction Both;10 reps   Modalities   Modalities Moist Heat   Moist Heat Therapy   Number Minutes Moist Heat 15 Minutes   Moist Heat Location Other (comment)  lumbar supine                PT Education - 06/26/14 1538    Education provided Yes   Education Details pilates 100's and bridges HEP; posture and body mechanics handout   Person(s) Educated Patient   Methods Explanation;Demonstration;Handout   Comprehension Verbalized understanding;Returned demonstration          PT Short Term Goals - 06/15/14 1601  PT SHORT TERM GOAL #1   Title Patient will demonstrate and express a good understanding of basic postural correction, basic body mechanics modifications to decrease exacerbation of symptoms.   Time 4   Period Weeks   Status New   PT SHORT TERM GOAL #2   Title Symptoms will be centralized 50% of the time.   Time 4   Period Weeks   Status New   PT SHORT TERM GOAL #3   Title Patient will be able to walk 3/4 of a mile with min complaint of pain.   Time 4   Period Weeks   Status New           PT Long Term Goals - 06/15/14 4403    PT LONG TERM GOAL #1   Title Patient will be independent in safe self progression of home ex program and self management techniques.   Time 8   Period Weeks   Status New   PT LONG TERM GOAL #2   Title The patient will have full and painless lumbar AROM needed for home and work tasks.     Time 8   Period Weeks   Status New   PT LONG TERM GOAL #3   Title The  patient will be knowledgable in basic concepts of lifting and body mechanics in assisting her disabled husband.     Time 8   Period Weeks   Status New   PT LONG TERM GOAL #4   Title Symptoms will be centralized 75% of the time with daily activities.     Time 8   Period Weeks   Status New   PT LONG TERM GOAL #5   Title FOTO functional outcome score will improve to 38% indicating improved function with less pain.   Time 8   Period Weeks   Status New               Plan - 06/26/14 1533    Clinical Impression Statement 2/10 pain upon arrival.   The pt. was able to progress to bridges with clams and alternating knee kickouts, and SLR and hip abduction was added due do her R hip muscles testing 4+/5.  Pt. was also taught proper pilates 100's technique for core strengthening.  She was instructed in proper posture and body mechanice, as she said she was bent over picking up sticks all last saturday.    PT Next Visit Plan continue bridges, hip and core strengthening, pilates 100's , review goals        Problem List Patient Active Problem List   Diagnosis Date Noted  . Degenerative arthritis of hip 07/29/2012    Kayton Ripp,McKinnley, SPTA 06/26/2014, 3:44 PM  [facility] [address] [place], [place], [address] Phone: [phone]   Fax:  [phone]

## 2014-06-28 ENCOUNTER — Ambulatory Visit: Payer: BC Managed Care – PPO | Admitting: Physical Therapy

## 2014-06-28 DIAGNOSIS — R262 Difficulty in walking, not elsewhere classified: Secondary | ICD-10-CM

## 2014-06-28 DIAGNOSIS — M2560 Stiffness of unspecified joint, not elsewhere classified: Secondary | ICD-10-CM

## 2014-06-28 DIAGNOSIS — M5442 Lumbago with sciatica, left side: Secondary | ICD-10-CM

## 2014-06-28 NOTE — Therapy (Addendum)
[place], [place], [address] Phone: [phone]   Fax:  [phone]  Physical Therapy Treatment  Patient Details  Name: Autumn Lindsey MRN: [id number] Date of Birth: June 12, 1950 Referring Provider:  Robyne Peers, MD  Encounter Date: 06/28/2014      PT End of Session - 06/28/14 1419    Visit Number 4   Number of Visits 16   Date for PT Re-Evaluation 08/09/14   PT Start Time 0217   PT Stop Time 0310   PT Time Calculation (min) 53 min      Past Medical History  Diagnosis Date  . PONV (postoperative nausea and vomiting)   . Headache(784.0)     in past  . Arthritis   . History of melanoma excision   . GERD (gastroesophageal reflux disease)   . Anemia   . Depression     Past Surgical History  Procedure Laterality Date  . Appendectomy  1970's  . Tonsillectomy    . Skin graft  1980's  . Breast enhancement surgery    . 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  Visit Diagnosis:  Left-sided low back pain with left-sided sciatica  Joint stiffness of spine  Difficulty walking      Subjective Assessment - 06/28/14 1419    Subjective I felt good after last treatment! Just a little tired.  I think I am doing a few of my exercises wrong at home.     Currently in Pain? Yes   Pain Score 4    Pain Location Knee   Pain Orientation Left                         [facility] Adult PT Treatment/Exercise - 06/28/14 1421    Lumbar Exercises: Standing   Other Standing Lumbar Exercises wall slides x10 3 second hold   Lumbar Exercises: Supine   Bent Knee Raise 5 reps  scissors level 3, 10 toe taps per rep   Bridge 10 reps;5 seconds  cues for abdominal bracing   Bridge Limitations 5  reps with 10 clams each; 5 reps with 4 alternating knee kick outs each   Straight Leg Raise 15 reps;3 seconds  bilateral; 2#   Other Supine Lumbar Exercises 3 reps pilates hundreds   Lumbar Exercises: Quadruped   [place] Horse 5 reps   Single Arm Raise 10 reps;3 seconds   Straight Leg Raise 10 reps;3 seconds   Opposite Arm/Leg Raise 10 reps;3 seconds  cues to control hip movement   Knee/Hip Exercises: Aerobic   Stationary Bike Nu step level 3 x 2 mins, level 2 x 5 mins  decreased resistance due to c/o left knee pain   Knee/Hip Exercises: Sidelying   Hip ABduction Both;15 reps  2#   Modalities   Modalities Moist Heat   Moist Heat Therapy   Number Minutes Moist Heat 15 Minutes   Moist Heat Location Other (comment)  lumbar supine                PT Education - 06/28/14 1511    Education Details Information for 6 weeks pilates class handout   Person(s) Educated Patient   Methods Handout;Explanation   Comprehension Verbalized understanding  PT Short Term Goals - 06/28/14 1509    PT SHORT TERM GOAL #1   Title Patient will demonstrate and express a good understanding of basic postural correction, basic body mechanics modifications to decrease exacerbation of symptoms.   Time 4   Period Weeks   Status On-going   PT SHORT TERM GOAL #2   Title Symptoms will be centralized 50% of the time.   Time 4   Period Weeks   Status On-going   PT SHORT TERM GOAL #3   Title Patient will be able to walk 3/4 of a mile with min complaint of pain.   Time 4   Period Weeks   Status On-going           PT Long Term Goals - 06/28/14 1509    PT LONG TERM GOAL #1   Title Patient will be independent in safe self progression of home ex program and self management techniques.   Time 8   Period Weeks   Status On-going   PT LONG TERM GOAL #2   Title The patient will have full and painless lumbar AROM needed for home and work tasks.     Time 8   Period Weeks   Status  On-going   PT LONG TERM GOAL #3   Title The patient will be knowledgable in basic concepts of lifting and body mechanics in assisting her disabled husband.     Time 8   Period Weeks   Status On-going   PT LONG TERM GOAL #4   Title Symptoms will be centralized 75% of the time with daily activities.     Time 8   Period Weeks   Status On-going   PT LONG TERM GOAL #5   Title FOTO functional outcome score will improve to 38% indicating improved function with less pain.   Time 8   Period Weeks   Status On-going               Plan - 06/28/14 1500    Clinical Impression Statement 4/10 pain in left knee upon arrival. Pt reports decreased radiation in left leg but continued episodes of seperate left hip, knee and ankle achy pain.  The pt. reported it as more of an achy pain.  She continues to progress in lumbar stabilization and was able to go into the quadruped position today.  Pt. was reminded to hold SLR and hip abduction exercises for 3 seconds each at home, as she said she forgot to hold them.  She seemed interested in a pilates class for low back pain and was given information pertaining to it.     PT Next Visit Plan continue bridges, hip and core strengthening, quadruped strengthening, wall slides. TRY TRACTION due to continued peripheralization of symptoms        Problem List Patient Active Problem List   Diagnosis Date Noted  . Degenerative arthritis of hip 07/29/2012    Dwight Burdo,[place], SPTA 06/28/2014, 3:24 PM  [place], [place], [address] Phone: [phone]   Fax:  [phone]

## 2014-07-02 ENCOUNTER — Ambulatory Visit: Payer: BC Managed Care – PPO | Admitting: Physical Therapy

## 2014-07-02 DIAGNOSIS — M25562 Pain in left knee: Secondary | ICD-10-CM

## 2014-07-02 DIAGNOSIS — M5442 Lumbago with sciatica, left side: Secondary | ICD-10-CM

## 2014-07-02 DIAGNOSIS — R262 Difficulty in walking, not elsewhere classified: Secondary | ICD-10-CM

## 2014-07-02 DIAGNOSIS — M2560 Stiffness of unspecified joint, not elsewhere classified: Secondary | ICD-10-CM

## 2014-07-02 NOTE — Patient Instructions (Addendum)
Trigger Point Dry Needling  . What is Trigger Point Dry Needling (DN)? o DN is a physical therapy technique used to treat muscle pain and dysfunction. Specifically, DN helps deactivate muscle trigger points (muscle knots).  o A thin filiform needle is used to penetrate the skin and stimulate the underlying trigger point. The goal is for a local twitch response (LTR) to occur and for the trigger point to relax. No medication of any kind is injected during the procedure.   . What Does Trigger Point Dry Needling Feel Like?  o The procedure feels different for each individual patient. Some patients report that they do not actually feel the needle enter the skin and overall the process is not painful. Very mild bleeding may occur. However, many patients feel a deep cramping in the muscle in which the needle was inserted. This is the local twitch response.   Marland Kitchen How Will I feel after the treatment? o Soreness is normal, and the onset of soreness may not occur for a few hours. Typically this soreness does not last longer than two days.  o Bruising is uncommon, however; ice can be used to decrease any possible bruising.  o In rare cases feeling tired or nauseous after the treatment is normal. In addition, your symptoms may get worse before they get better, this period will typically not last longer than 24 hours.   . What Can I do After My Treatment? o Increase your hydration by drinking more water for the next 24 hours. o You may place ice or heat on the areas treated that have become sore, however, do not use heat on inflamed or bruised areas. Heat often brings more relief post needling. o You can continue your regular activities, but vigorous activity is not recommended initially after the treatment for 24 hours. o DN is best combined with other physical therapy such as strengthening, stretching, and other therapies.   Quadratus lumborum stretch handout and demo as shown in clinic on right sidelying  with left side up for at least 3 minutes stretch.   Hamstring Step 3   Left leg in maximal straight leg raise, heel at maximal stretch, straighten knee further by tightening knee cap. Warning: Intense stretch. Stay within tolerance. Tight but comfortable  Hold 30-60___ seconds. Relax knee cap only.  For IT band stretch, same but bring left great toe over right shoulder for IT band stretch.  Hold 30 to 60 seconds 2-3 times.  Sitting piriformis stretch as shown in clinic.  30 to 60 seconds  At work when needed Repeat _2-3__ times.          Voncille Lo, PT 07/02/2014 12:49 PM Phone: [phone] Fax: [phone]

## 2014-07-02 NOTE — Therapy (Signed)
[place], [place], [address] Phone: [phone]   Fax:  [phone]  Physical Therapy Treatment  Patient Details  Name: Autumn Lindsey MRN: [id number] Date of Birth: 08-Jun-1950 Referring Provider:  Robyne Peers, MD  Encounter Date: 07/02/2014      PT End of Session - 07/02/14 1237    Visit Number 5   Number of Visits 16   Date for PT Re-Evaluation 08/09/14   PT Start Time 0086   PT Stop Time 1331   PT Time Calculation (min) 56 min   Activity Tolerance Patient tolerated treatment well   Behavior During Therapy [facility] for tasks assessed/performed      Past Medical History  Diagnosis Date  . PONV (postoperative nausea and vomiting)   . Headache(784.0)     in past  . Arthritis   . History of melanoma excision   . GERD (gastroesophageal reflux disease)   . Anemia   . Depression     Past Surgical History  Procedure Laterality Date  . Appendectomy  1970's  . Tonsillectomy    . Skin graft  1980's  . Breast enhancement surgery    . 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  Visit Diagnosis:  Left-sided low back pain with left-sided sciatica  Joint stiffness of spine  Difficulty walking  Knee pain, left      Subjective Assessment - 07/02/14 1237    Subjective I only did my exercise one time this weekend.  I did do my exercises this morning. 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  I really dont have pain running down my leg.  I don't have tingling down my leg.  I have a lumbar support   Pertinent History Right THR 07/30/12    How long can you sit comfortably? 1 hour   How long can you stand comfortably? > 15 min   How long can you walk comfortably? < 1/2 mile (previously 2-3 miles)   Patient Stated Goals decrease pain; get back to walking and usual activities   Currently in Pain? Yes   Pain Score 3   after dry needling 1/10   Pain Location Back   Pain Orientation Left   Pain Descriptors / Indicators Sore   Pain Type Chronic pain   Pain Onset More than a month ago   Pain Frequency Intermittent   Aggravating Factors  weeding garden, walk, stairs, lifting                         [facility] Adult PT Treatment/Exercise - 07/02/14 1309    Posture/Postural Control   Posture/Postural Control Postural limitations   Postural Limitations Anterior pelvic tilt   Posture Comments QL on left elevated with anterior shear.  Educated on standing posture and reduction of anterior lean..  Pt educated on standing with even wt on bil feel and in heels    Lumbar Exercises: Stretches   Passive Hamstring Stretch 2 reps;30 seconds   ITB Stretch 2 reps;30 seconds  VC and TC   Piriformis Stretch 2 reps;30 seconds  sitting stretch for work VC    Lumbar Exercises: Sidelying   Other Sidelying Lumbar Exercises Quadratus Lumborum Left sidelying on right with pillow for 3 minutes  Modalities   Modalities Moist Heat   Moist Heat Therapy   Number Minutes Moist Heat 15 Minutes   Moist Heat Location Other (comment)  lumbar supine   Manual Therapy   Manual Therapy Myofascial release   Myofascial Release Right QL MFR in left sidelying           Trigger Point Dry Needling - 07/02/14 1249    Consent Given? Yes   Education Handout Provided Yes   Muscles Treated Lower Body Gluteus minimus;Gluteus maximus;Piriformis  Quadratus Lumborum Left side with twitch and palpable length   Gluteus Maximus Response Twitch response elicited;Palpable increased muscle length   Gluteus Minimus Response Twitch response elicited;Palpable increased muscle length   Piriformis Response Twitch response elicited              PT Education - 07/02/14 1249    Education provided Yes   Education  Details trigger point dry needling education   Person(s) Educated Patient   Methods Explanation;Handout;Demonstration   Comprehension Verbalized understanding          PT Short Term Goals - 07/02/14 1334    PT SHORT TERM GOAL #1   Title Patient will demonstrate and express a good understanding of basic postural correction, basic body mechanics modifications to decrease exacerbation of symptoms.   Time 4   Period Weeks   Status On-going   PT SHORT TERM GOAL #2   Title Symptoms will be centralized 50% of the time.   Time 4   Period Weeks   Status On-going   PT SHORT TERM GOAL #3   Title Patient will be able to walk 3/4 of a mile with min complaint of pain.   Time 4   Period Weeks   Status On-going           PT Long Term Goals - 07/02/14 1335    PT LONG TERM GOAL #1   Title Patient will be independent in safe self progression of home ex program and self management techniques.   Time 8   Period Weeks   Status On-going   PT LONG TERM GOAL #2   Title The patient will have full and painless lumbar AROM needed for home and work tasks.     Time 8   Period Weeks   Status On-going   PT LONG TERM GOAL #3   Title The patient will be knowledgable in basic concepts of lifting and body mechanics in assisting her disabled husband.     Time 8   Period Weeks   Status On-going   PT LONG TERM GOAL #4   Title Symptoms will be centralized 75% of the time with daily activities.     Time 8   Period Weeks   Status On-going   PT LONG TERM GOAL #5   Title FOTO functional outcome score will improve to 38% indicating improved function with less pain.   Time 8   Period Weeks   Status On-going               Plan - 07/02/14 1331    Clinical Impression Statement Pt with 3/10 left back pain and residual knee pain.  Pt with trigger point pain in left quadratus lumborum and Left piriformis.  Pt wanted to try trigger point dry needling for Left lumbar pain and gluteal pain. Pt is  more  aware of tightness in Left Quadratus..  Making progress towards goals with posture education.  continuing progress toward all other goals and pain  control. today.   Pt will benefit from skilled therapeutic intervention in order to improve on the following deficits Pain;Increased muscle spasms;Decreased range of motion;Decreased strength   Rehab Potential Good   Clinical Impairments Affecting Rehab Potential Right THR anterior approach 2014 with good outcome   PT Frequency 2x / week   PT Duration 8 weeks   PT Treatment/Interventions ADLs/Self Care Home Management;Electrical Stimulation;Moist Heat;Traction;Therapeutic exercise;Patient/family education;Dry needling;Manual techniques   PT Next Visit Plan continue bridges, hip and core strengthening, quadruped strengthening, wall slides. Assess benefit of dry needling for tight quadratus on left   Consulted and Agree with Plan of Care Patient        Problem List Patient Active Problem List   Diagnosis Date Noted  . Degenerative arthritis of hip 07/29/2012    Voncille Lo, PT 07/02/2014 1:38 PM Phone: [phone] Fax: [facility] [address] [place], [place], [address] Phone: [phone]   Fax:  [phone]

## 2014-07-05 ENCOUNTER — Ambulatory Visit: Payer: BC Managed Care – PPO | Admitting: Physical Therapy

## 2014-07-05 DIAGNOSIS — M2560 Stiffness of unspecified joint, not elsewhere classified: Secondary | ICD-10-CM

## 2014-07-05 DIAGNOSIS — R262 Difficulty in walking, not elsewhere classified: Secondary | ICD-10-CM

## 2014-07-05 DIAGNOSIS — M5442 Lumbago with sciatica, left side: Secondary | ICD-10-CM | POA: Diagnosis not present

## 2014-07-05 DIAGNOSIS — M25562 Pain in left knee: Secondary | ICD-10-CM

## 2014-07-05 DIAGNOSIS — M25551 Pain in right hip: Secondary | ICD-10-CM

## 2014-07-05 NOTE — Patient Instructions (Addendum)
Pt given hip strengthening exercise sheet  From JOSPT Hip strengthening.    Prone hip extension 3 x 10 Clamshell Right and Left side 3 x 10 with Green Tband Prone hip ext and knee flex 3 x 10 Lateral monster walk with green t band 3 x10 Single limb bridge 3x 10  I T band stretch review in supine and by wall    Voncille Lo, PT 07/05/2014 11:38 AM Phone: [phone] Fax: [phone]

## 2014-07-05 NOTE — Therapy (Signed)
[place], [place], [address] Phone: [phone]   Fax:  [phone]  Physical Therapy Treatment  Patient Details  Name: Autumn Lindsey MRN: [id number] Date of Birth: [birth year] Referring Provider:  Robyne Peers, MD  Encounter Date: 07/05/2014      PT End of Session - 07/05/14 1207    Visit Number 6   Number of Visits 16   Date for PT Re-Evaluation 08/09/14   PT Start Time 1100   PT Stop Time 1156   PT Time Calculation (min) 56 min   Activity Tolerance Patient tolerated treatment well   Behavior During Therapy [facility] for tasks assessed/performed      Past Medical History  Diagnosis Date  . PONV (postoperative nausea and vomiting)   . Headache(784.0)     in past  . Arthritis   . History of melanoma excision   . GERD (gastroesophageal reflux disease)   . Anemia   . Depression     Past Surgical History  Procedure Laterality Date  . Appendectomy  1970's  . Tonsillectomy    . Skin graft  1980's  . Breast enhancement surgery    . 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  Visit Diagnosis:  Left-sided low back pain with left-sided sciatica  Joint stiffness of spine  Difficulty walking  Knee pain, left  Right hip pain      Subjective Assessment - 07/05/14 1104    Subjective Pain level is 1/10,  It is hard to get in and out of cars . strretches do help. Not taking medication today or in two weeks.  Using heat at night.  Able to walk one mile without exacerbation of symptoms. Low back is better. Right > Left IT band more of issue   Pertinent History Right THR 07/30/12    Limitations Walking   Currently in Pain? No/denies  using heat but no medicine   Pain Location Back   Pain  Score 5   Pain Location Hip  IT band    Pain Orientation Right;Left  Right greater than Left   Pain Descriptors / Indicators Sore;Aching   Pain Type Chronic pain   Pain Onset More than a month ago   Pain Frequency Intermittent   Aggravating Factors  getting in and out of car and helping to lift husband   Pain Relieving Factors heat                         [facility] Adult PT Treatment/Exercise - 07/05/14 1110    Posture/Postural Control   Posture Comments Noted Right QL slightly elevated and decreased back pain this visit    Lumbar Exercises: Stretches   Passive Hamstring Stretch 2 reps;30 seconds   ITB Stretch 2 reps;30 seconds  VC and TC standing and supine with strap   Piriformis Stretch 2 reps;30 seconds  sitting stretch for work [employer]    Lumbar Exercises: Sidelying   Other Sidelying Lumbar Exercises Quadratus Lumborum Left sidelying on right with pillow for 3 minutes   Knee/Hip Exercises: Sidelying   Clams 2 x 10 with resisitance by PT   Pt given green tband for home use   Knee/Hip Exercises: Prone   Hamstring Curl 1 set;15 reps  bilateral   Hamstring Curl Limitations VC for complete range   Hip Extension Strengthening;Both;1 set;10 reps   Hip Extension Limitations VC for complete range and neutral pelvic   Manual Therapy   Myofascial Release IASTM to Right IT band in left sidelying    Other Manual Therapy KT tape star compression over R greater trochanter                PT Education - 07/05/14 1129    Education Details IT band supine and standing and Hip strengthening (JOSPT) handout , initial discussion of transferring husband with dementia body mechanics, initial KT self taping.    Person(s) Educated Patient   Methods Explanation;Demonstration;Tactile cues;Verbal cues;Handout   Comprehension Verbalized understanding;Returned demonstration          PT Short Term Goals - 07/05/14 1148    PT SHORT TERM GOAL #1   Title Patient will demonstrate  and express a good understanding of basic postural correction, basic body mechanics modifications to decrease exacerbation of symptoms.   Time 4   Period Weeks   Status Achieved   PT SHORT TERM GOAL #2   Title Symptoms will be centralized 50% of the time.   Time 4   Period Weeks   Status Achieved   PT SHORT TERM GOAL #3   Title Patient will be able to walk 3/4 of a mile with min complaint of pain.   Time 4   Period Weeks   Status Achieved           PT Long Term Goals - 07/05/14 1149    PT LONG TERM GOAL #1   Title Patient will be independent in safe self progression of home ex program and self management techniques.   Time 8   Period Weeks   Status On-going   PT LONG TERM GOAL #2   Title The patient will have full and painless lumbar AROM needed for home and work tasks.     Time 8   Period Weeks   Status On-going   PT LONG TERM GOAL #3   Title The patient will be knowledgable in basic concepts of lifting and body mechanics in assisting her disabled husband.     Time 8   Period Weeks   Status On-going   PT LONG TERM GOAL #4   Title Symptoms will be centralized 75% of the time with daily activities.     Time 8   Period Weeks   Status On-going   PT LONG TERM GOAL #5   Title FOTO functional outcome score will improve to 38% indicating improved function with less pain.   Time 8   Period Weeks   Status On-going               Plan - 07/05/14 1151    Clinical Impression Statement Pt did not prefer dry needling and would like to do hand on manual instead of dry needling for future visits.  Pt Quadratus on Left was improved and now Right Quadratus slighting higher.  Pt no longer taking pain medication andusing heat for back and hip.  Pt chief complaint is IT band pain R>L  . All short term goals achieved.  Pt able to walk for 1 mile without exacerbation of pain . And back pain has centralized and abated as of this visit.     Pt will benefit from skilled therapeutic  intervention in order to improve on the following deficits Pain;Increased muscle spasms;Decreased range of motion;Decreased strength  Rehab Potential Good   Clinical Impairments Affecting Rehab Potential Right THR anterior approach 2014 with good outcome   PT Duration 8 weeks   PT Treatment/Interventions ADLs/Self Care Home Management;Electrical Stimulation;Moist Heat;Traction;Therapeutic exercise;Patient/family education;Dry needling;Manual techniques   PT Next Visit Plan discuss transferring with husband.  body mechanics. Assess KT tape for hip maybe using a sliding board.  begin standing hip strength. lunge, wall slides        Problem List Patient Active Problem List   Diagnosis Date Noted  . Degenerative arthritis of hip 07/29/2012   Voncille Lo, PT 07/05/2014 12:21 PM Phone: [phone] Fax: [facility] [address] [place], [place], [address] Phone: [phone]   Fax:  [phone]

## 2014-07-09 ENCOUNTER — Ambulatory Visit: Payer: BC Managed Care – PPO | Attending: Orthopaedic Surgery | Admitting: Physical Therapy

## 2014-07-09 DIAGNOSIS — R262 Difficulty in walking, not elsewhere classified: Secondary | ICD-10-CM

## 2014-07-09 DIAGNOSIS — M2560 Stiffness of unspecified joint, not elsewhere classified: Secondary | ICD-10-CM | POA: Diagnosis not present

## 2014-07-09 DIAGNOSIS — M5442 Lumbago with sciatica, left side: Secondary | ICD-10-CM

## 2014-07-09 DIAGNOSIS — M25551 Pain in right hip: Secondary | ICD-10-CM

## 2014-07-09 DIAGNOSIS — M25562 Pain in left knee: Secondary | ICD-10-CM

## 2014-07-09 NOTE — Patient Instructions (Signed)
ABDUCTION: Standing - Resistance Band (Active)   Stand, feet flat. Against yellow resistance band, lift right leg out to side. Complete _2__ sets of __10_ repetitions. Perform __2_ sessions per day.  ADDUCTION: Standing - Stable: Resistance Band (Active)   Stand, right leg out to side as far as possible. Against yellow resistance band, draw leg in across midline. Complete __2_ sets of __10_ repetitions. Perform _2__ sessions per day.  Strengthening: Hip Flexion - Resisted   With tubing around left ankle, anchor behind, bring leg forward, keeping knee straight. Repeat __10__ times per set. Do ___2_ sets per session. Do __2__ sessions per day.  Strengthening: Hip Extension - Resisted   With tubing around right ankle, face anchor and pull leg straight back. Repeat _10___ times per set. Do __2__ sets per session. Do _2___ sessions per day.

## 2014-07-09 NOTE — Therapy (Signed)
Autumn [place], [place], [address] Phone: [phone]   Fax:  [phone]  Physical Therapy Treatment  Patient Details  Name: Autumn Lindsey MRN: [id number] Date of Birth: 09-18-1950 Referring Provider:  Robyne Peers, MD  Encounter Date: 07/09/2014      PT End of Session - 07/09/14 1106    Visit Number 7   Number of Visits 16   Date for PT Re-Evaluation 08/09/14   PT Start Time 1104   PT Stop Time 1202   PT Time Calculation (min) 58 min      Past Medical History  Diagnosis Date  . PONV (postoperative nausea and vomiting)   . Headache(784.0)     in past  . Arthritis   . History of melanoma excision   . GERD (gastroesophageal reflux disease)   . Anemia   . Depression     Past Surgical History  Procedure Laterality Date  . Appendectomy  1970's  . Tonsillectomy    . Skin graft  1980's  . Breast enhancement surgery    . 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  Visit Diagnosis:  Left-sided low back pain with left-sided sciatica  Joint stiffness of spine  Difficulty walking  Knee pain, left  Right hip pain      Subjective Assessment - 07/09/14 1211    Subjective I've been doing my stretches at work.  I'll use a hot pad on my knee at night to help before I got to bed.  I was able to go for a walk the other day and walked for a few miles, although I definitely felt it in my back afterwards.  It's not a high amount of pain, it's just some discomfort and is always there.  I didn't notice any difference with dry needling or the KT tape.              [facility] PT Assessment - 07/09/14 1136    AROM   Lumbar Flexion 70   Lumbar Extension 30                     [facility]  Adult PT Treatment/Exercise - 07/09/14 1138    Knee/Hip Exercises: Standing   SLS with Vectors 4 way hip red theraband x 10 each bilateral   Modalities   Modalities Moist Heat   Moist Heat Therapy   Number Minutes Moist Heat 15 Minutes   Moist Heat Location Other (comment)  lumbar supine                PT Education - 07/09/14 1206    Education Details SELF CARE: proper use of body mechanics and positioning to lift husband from wheelchair to bed and back, use of sliding board; Standing theraband 4 way hip HEP    Person(s) Educated Patient   Methods Handout;Explanation;Demonstration;Verbal cues   Comprehension Verbalized understanding;Returned demonstration;Verbal cues required          PT Short Term Goals - 07/05/14 1148    PT SHORT TERM GOAL #1   Title Patient will demonstrate and express a good understanding of basic postural correction, basic body mechanics modifications to decrease exacerbation of symptoms.   Time 4   Period Weeks   Status Achieved   PT  SHORT TERM GOAL #2   Title Symptoms will be centralized 50% of the time.   Time 4   Period Weeks   Status Achieved   PT SHORT TERM GOAL #3   Title Patient will be able to walk 3/4 of a mile with min complaint of pain.   Time 4   Period Weeks   Status Achieved           PT Long Term Goals - 07/09/14 1113    PT LONG TERM GOAL #1   Title Patient will be independent in safe self progression of home ex program and self management techniques.   Time 8   Period Weeks   Status Achieved   PT LONG TERM GOAL #2   Title The patient will have full and painless lumbar AROM needed for home and work tasks.     Time 8   Period Weeks   Status Achieved   PT LONG TERM GOAL #3   Title The patient will be knowledgable in basic concepts of lifting and body mechanics in assisting her disabled husband.     Time 8   Period Weeks   Status On-going   PT LONG TERM GOAL #4   Title Symptoms will be centralized 75% of the time  with daily activities.     Time 8   Period Weeks   Status Partially Met   PT LONG TERM GOAL #5   Title FOTO functional outcome score will improve to 38% indicating improved function with less pain.   Time 8   Period Weeks   Status On-going               Plan - 07/09/14 1213    Clinical Impression Statement The pt. reported that she did not notice a difference with dry needling and the KT tape.  LTG's #1 and #2 are achieved as she has full lumbar AROM without much pain and is working on using proper body mechanics and posture to bend down, clean, and complete daily tasks.  The pt. reports that she doesn't have pain in her ankle anymore but that the pain in her knee will come and go throughout each day.  She was able to progress to bilateral standing 4 way hip with a red theraband.  The pt. was instructed in the use of a sliding board for transferring her husband and also the use proper body mechanics and positioning throughout the transfer.    PT Next Visit Plan assess response to education regarding transferring her husband.  continue standing hip strength. lunge, wall slides        Problem List Patient Active Problem List   Diagnosis Date Noted  . Degenerative arthritis of hip 07/29/2012    [place]ie Basher,[place], SPTA 07/09/2014, 12:23 PM  [facility] [address] [place], [place], [address] Phone: [phone]   Fax:  [phone]

## 2014-07-12 ENCOUNTER — Ambulatory Visit: Payer: BC Managed Care – PPO | Admitting: Physical Therapy

## 2014-07-12 ENCOUNTER — Encounter: Payer: Commercial Managed Care - PPO | Admitting: Physical Therapy

## 2014-07-12 DIAGNOSIS — M5442 Lumbago with sciatica, left side: Secondary | ICD-10-CM | POA: Diagnosis not present

## 2014-07-12 DIAGNOSIS — R262 Difficulty in walking, not elsewhere classified: Secondary | ICD-10-CM

## 2014-07-12 DIAGNOSIS — M2560 Stiffness of unspecified joint, not elsewhere classified: Secondary | ICD-10-CM

## 2014-07-12 DIAGNOSIS — M25562 Pain in left knee: Secondary | ICD-10-CM

## 2014-07-13 NOTE — Therapy (Addendum)
[place], [place], [address] Phone: [phone]   Fax:  [phone]  Physical Therapy Treatment  Patient Details  Name: Autumn Lindsey MRN: [id number] Date of Birth: Oct 09, 1950 Referring Provider:  Robyne Peers, MD  Encounter Date: 07/12/2014      PT End of Session - 07/13/14 0659    Visit Number 8   Number of Visits 16   Date for PT Re-Evaluation 08/09/14   PT Start Time 1500   PT Stop Time 1545   PT Time Calculation (min) 45 min   Activity Tolerance Patient tolerated treatment well      Past Medical History  Diagnosis Date  . PONV (postoperative nausea and vomiting)   . Headache(784.0)     in past  . Arthritis   . History of melanoma excision   . GERD (gastroesophageal reflux disease)   . Anemia   . Depression     Past Surgical History  Procedure Laterality Date  . Appendectomy  1970's  . Tonsillectomy    . Skin graft  1980's  . Breast enhancement surgery    . 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  Visit Diagnosis:  Left-sided low back pain with left-sided sciatica  Joint stiffness of spine  Difficulty walking  Knee pain, left      Subjective Assessment - 07/12/14 1504    Subjective Left lateral knee pain, back is OK but I know it's there.  Saw Dr. Ninfa Linden and he saw some disc compression and said to continue my exercises. Symptoms are intermittent but present daily.  Has hired weekend help for her husband .  States she usually has help with lifting him.     Currently in Pain? Yes   Pain Score 1    Aggravating Factors  lifting leg to get in /out of the car   Pain Relieving Factors bridges and core stuff have helped;  not sure if ITB has helped                          [facility] Adult PT Treatment/Exercise - 07/12/14 1558    Lumbar Exercises: Supine   Ab Set 10 reps;5 seconds   Bent Knee Raise 5 reps   Isometric Hip Flexion 10 reps;5 seconds   Knee/Hip Exercises: Prone   Hamstring Curl 10 reps  with multifidi pelvic press   Hip Extension Both;1 set;10 reps  with multifidi pelvic press   Other Prone Exercises pelvic press with bent knee hip extension 8x right/left   Other Prone Exercises head lift with UE Is,Ts,Ws, Ms, Ys 5x each   Manual Therapy   Manual Therapy Joint mobilization   Joint Mobilization Left long axis distraction, inferior, PA in IR;AP, AP in IR and ER grade 3/4 3x 30 sec each; sciatic nerve glide in supine 15x; subtalar distract 3x grade 4                PT Education - 07/13/14 0659    Education provided Yes   Education Details multifidi prone series   Person(s) Educated Patient   Methods Explanation;Demonstration;Handout   Comprehension Verbalized understanding;Returned demonstration          PT Short Term Goals - 07/13/14 [phone]  PT SHORT TERM GOAL #1   Title Patient will demonstrate and express a good understanding of basic postural correction, basic body mechanics modifications to decrease exacerbation of symptoms.   Status Achieved   PT SHORT TERM GOAL #2   Title Symptoms will be centralized 50% of the time.   Status Achieved   PT SHORT TERM GOAL #3   Title Patient will be able to walk 3/4 of a mile with min complaint of pain.   Status Achieved           PT Long Term Goals - 07/13/14 4445    PT LONG TERM GOAL #1   Title Patient will be independent in safe self progression of home ex program and self management techniques.   Status Achieved   PT LONG TERM GOAL #2   Title The patient will have full and painless lumbar AROM needed for home and work tasks.     Status Achieved   PT LONG TERM GOAL #3   Title The patient will be knowledgable in basic concepts of lifting  and body mechanics in assisting her disabled husband.     Status Achieved   PT LONG TERM GOAL #4   Title Symptoms will be centralized 75% of the time with daily activities.     Time 8   Period Weeks   Status Partially Met   PT LONG TERM GOAL #5   Title FOTO functional outcome score will improve to 38% indicating improved function with less pain.   Time 8   Period Weeks   Status On-going               Plan - 07/13/14 0700    Clinical Impression Statement The patient reports overall decreasing intensity of the pain but still present left lateral thigh intermittently and mild.  She is receptive to core strengthening with minimal cues needed for transverse abdominal activation.  Good multifidi activation with verbal and tactile instruction.  No LE peripheral exacerbation with neural mob.  Recommend decreased treatment frequency to 1x/week with probable discharge in 3-4 visits.    PT Next Visit Plan Review multifidi activation; left hip mobs; Single leg stand with UE movements for glut strengthening        Problem List Patient Active Problem List   Diagnosis Date Noted  . Degenerative arthritis of hip 07/29/2012    Alvera Singh 07/13/2014, 7:07 AM  [facility] [address] [place], [place], [address] Phone: [phone]   Fax:  [phone]   Ruben Im, PT 07/13/2014 7:07 AM Phone: [phone] Fax: [phone]  PHYSICAL THERAPY DISCHARGE SUMMARY  Visits from Start of Care: 8  Current functional level related to goals / functional outcomes: See clinical impression above.  The patient called to cancel remaining appts, stating "no need, doing good."  The patient met majority of goals but did not return to assess remaining goals.   Remaining deficits: See above   Education / Equipment: HEP   Plan: Patient agrees to discharge.  Patient goals were partially met. Patient is being discharged due to the  patient's request.   Ruben Im, PT 10/19/2014 7:44 AM Phone: [phone] Fax: [phone] ???

## 2014-07-13 NOTE — Patient Instructions (Signed)
Lumbar multifidi pelvic press in prone series 8 reps each:  With HS curls, hip extension, UE/head lift

## 2014-07-17 ENCOUNTER — Ambulatory Visit: Payer: BC Managed Care – PPO | Admitting: Physical Therapy

## 2014-07-27 ENCOUNTER — Ambulatory Visit: Payer: BC Managed Care – PPO | Admitting: Physical Therapy

## 2014-07-31 ENCOUNTER — Encounter: Payer: Commercial Managed Care - PPO | Admitting: Physical Therapy

## 2014-08-07 ENCOUNTER — Encounter: Payer: Commercial Managed Care - PPO | Admitting: Physical Therapy

## 2014-08-14 ENCOUNTER — Encounter: Payer: Commercial Managed Care - PPO | Admitting: Physical Therapy

## 2016-04-02 ENCOUNTER — Telehealth (INDEPENDENT_AMBULATORY_CARE_PROVIDER_SITE_OTHER): Payer: Self-pay | Admitting: Orthopaedic Surgery

## 2016-04-02 NOTE — Telephone Encounter (Signed)
Patient called asked if it is ok for her to have an MRI after getting her hip replacement. Patient said she is going to be in a trail study. The number to contact her is [phone]

## 2016-04-03 NOTE — Telephone Encounter (Signed)
Patient aware her replacement is MRI compatible

## 2016-04-27 ENCOUNTER — Telehealth (INDEPENDENT_AMBULATORY_CARE_PROVIDER_SITE_OTHER): Payer: Self-pay | Admitting: Orthopaedic Surgery

## 2016-04-27 NOTE — Telephone Encounter (Signed)
Patient called needing documentation that its safe for her to receive a MRI this coming Wednesday. She had hip surgery a few years back. Please fax the okay to [phone]  Her CB # [phone]

## 2016-04-27 NOTE — Telephone Encounter (Signed)
Faxed note to provided number

## 2017-04-05 ENCOUNTER — Telehealth (INDEPENDENT_AMBULATORY_CARE_PROVIDER_SITE_OTHER): Payer: Self-pay | Admitting: Orthopaedic Surgery

## 2017-04-05 NOTE — Telephone Encounter (Signed)
See below, I think it's been quite sometime since we've seen her, you want to see her first?

## 2017-04-05 NOTE — Telephone Encounter (Signed)
Do see what she needs it for and tell her we will likely need to se her first from an insurance standpoint for ordering PT for someone who has not been seen in a while.  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

## 2017-04-05 NOTE — Telephone Encounter (Signed)
Patient called asked if she can be referred for (PT) The number to contact patient is [phone]

## 2017-04-06 ENCOUNTER — Telehealth (INDEPENDENT_AMBULATORY_CARE_PROVIDER_SITE_OTHER): Payer: Self-pay | Admitting: Orthopaedic Surgery

## 2017-04-06 NOTE — Telephone Encounter (Signed)
Can we make her an appt, it has been such a long time since we've seen her

## 2017-04-06 NOTE — Telephone Encounter (Signed)
Called patient left message on voicemail to return call to schedule appointment.  [phone]

## 2017-04-07 ENCOUNTER — Telehealth (INDEPENDENT_AMBULATORY_CARE_PROVIDER_SITE_OTHER): Payer: Self-pay | Admitting: Orthopaedic Surgery

## 2017-04-07 NOTE — Telephone Encounter (Signed)
Patient scheduled to see Dr. Ninfa Linden 04/19/17 at 8:30am

## 2017-04-19 ENCOUNTER — Encounter (INDEPENDENT_AMBULATORY_CARE_PROVIDER_SITE_OTHER): Payer: Self-pay | Admitting: Orthopaedic Surgery

## 2017-04-19 ENCOUNTER — Ambulatory Visit (INDEPENDENT_AMBULATORY_CARE_PROVIDER_SITE_OTHER): Payer: BC Managed Care – PPO | Admitting: Orthopaedic Surgery

## 2017-04-19 DIAGNOSIS — M7062 Trochanteric bursitis, left hip: Secondary | ICD-10-CM | POA: Diagnosis not present

## 2017-04-19 DIAGNOSIS — M7632 Iliotibial band syndrome, left leg: Secondary | ICD-10-CM | POA: Diagnosis not present

## 2017-04-19 NOTE — Progress Notes (Signed)
The patient is someone I seen in the past but have not seen him for quite some time.  She had a right total hip arthroplasty directions approach 5 years ago and is never had any issues with it at all.  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  Her right hip she said is doing well.  She denies any groin pain on the left side and denies any injuries.  It hurts mainly on the side of her trochanteric area radiating down the iliotibial band and just past the knee.  She denies any significant knee issues.  On exam she has excellent range of motion of her left and right hips Range of motion of the left knee.  Her pain is all along the trochanteric area and the iliotibial band.  She would definitely benefit from outpatient physical therapy to work on modalities to decrease her pain and help get this area feeling better.  All questions concerns were answered and addressed.  I gave her prescription for outpatient physical therapy.  She will follow-up as needed otherwise.

## 2018-05-16 ENCOUNTER — Ambulatory Visit (INDEPENDENT_AMBULATORY_CARE_PROVIDER_SITE_OTHER): Payer: BC Managed Care – PPO | Admitting: Orthopaedic Surgery

## 2018-05-16 ENCOUNTER — Ambulatory Visit (INDEPENDENT_AMBULATORY_CARE_PROVIDER_SITE_OTHER): Payer: Self-pay

## 2018-05-16 DIAGNOSIS — M25552 Pain in left hip: Secondary | ICD-10-CM

## 2018-05-16 NOTE — Progress Notes (Signed)
Office Visit Note   Patient: Autumn Lindsey           Date of Birth: 1950/11/03           MRN: [id number] Visit Date: 05/16/2018              Requested by: Robyne Peers, MD [address] [place], [place] [address] PCP: Robyne Peers, MD   Assessment & Plan: Visit Diagnoses:  1. Pain in left hip     Plan: I certainly feel that this is an issue with her hip but also potentially her back as well given the radicular symptoms.  I would like to obtain an MRI of the left hip to assess the cartilage given the worsening findings I am seeing on plain film suggesting arthritic problems.  Given her radicular symptoms down her left leg I would also like obtain an MRI of the lumbar spine to rule out nerve compression.  At this point she can stop physical therapy since that has not helped and will continue anti-inflammatories as needed.  We will see her back after the MRIs.  Follow-Up Instructions: Return in about 3 weeks (around 06/06/2018).   Orders:  Orders Placed This Encounter  Procedures  . XR HIP UNILAT W OR W/O PELVIS 1V LEFT   No orders of the defined types were placed in this encounter.     Procedures: No procedures performed   Clinical Data: No additional findings.   Subjective: Chief Complaint  Patient presents with  . Left Hip - Follow-up, Pain  Patient is a very pleasant 68 year old female well-known to me.  She has remote history of a right total hip arthroplasty that we performed and is never had any issues.  She has been having left hip pain for several years now.  We felt this was likely trochanteric bursitis but she is definitely not getting better even after physical therapy and activity modification.  She is less stiff but still having pain all around her hip specially in the groin but also radiates down past her knee.  It does go up into the back area as well.  Physical therapy is helped her maintain some flexibility but certainly things are  getting worse and now she is failed conservative treatment for over a year.  HPI  Review of Systems She currently denies any headache, chest pain, shortness of breath, fever, chills, nausea, vomiting  Objective: Vital Signs: There were no vitals taken for this visit.  Physical Exam She is alert and orient x3.  She appears younger than her stated age and is very flexible and is petite. Ortho Exam Examination of her right total hip shows that he moves fluidly and full examination her left hip shows pain on the extremes of rotation in the groin but also on the lateral aspect of her hip.  She does have a positive straight leg raise as well though on the left side and pain in the low back as well as radicular symptoms between the knee and ankle on the lateral aspect of her leg. Specialty Comments:  No specialty comments available.  Imaging: Xr Hip Unilat W Or W/o Pelvis 1v Left  Result Date: 05/16/2018 An AP pelvis and a lateral of the left hip compared to previous films over the years shows worsening arthritis of the left hip with superior lateral joint space narrowing.  There are sclerotic changes in the acetabulum and femoral head and periarticular osteophytes that have developed that were  not there before.    PMFS History: Patient Active Problem List   Diagnosis Date Noted  . Trochanteric bursitis, left hip 04/19/2017  . It band syndrome, left 04/19/2017  . Degenerative arthritis of hip 07/29/2012   Past Medical History:  Diagnosis Date  . Anemia   . Arthritis   . Depression   . GERD (gastroesophageal reflux disease)   . Headache(784.0)    in past  . History of melanoma excision   . PONV (postoperative nausea and vomiting)     No family history on file.  Past Surgical History:  Procedure Laterality Date  . APPENDECTOMY  1970's  . BREAST ENHANCEMENT SURGERY    . SKIN GRAFT  1980's  . TONSILLECTOMY    . TOTAL HIP ARTHROPLASTY Right 07/29/2012   Procedure: RIGHT TOTAL HIP  ARTHROPLASTY ANTERIOR APPROACH;  Surgeon: Mcarthur Rossetti, MD;  Location: [facility];  Service: Orthopedics;  Laterality: Right;   Social History   Occupational History  . Not on file  Tobacco Use  . Smoking status: Never Smoker  Substance and Sexual Activity  . Alcohol use: Yes    Comment: occasional  . Drug use: No  . Sexual activity: Not on file

## 2018-05-17 ENCOUNTER — Other Ambulatory Visit (INDEPENDENT_AMBULATORY_CARE_PROVIDER_SITE_OTHER): Payer: Self-pay

## 2018-05-17 DIAGNOSIS — M4807 Spinal stenosis, lumbosacral region: Secondary | ICD-10-CM

## 2018-05-28 ENCOUNTER — Ambulatory Visit (HOSPITAL_BASED_OUTPATIENT_CLINIC_OR_DEPARTMENT_OTHER): Payer: BC Managed Care – PPO

## 2018-05-28 ENCOUNTER — Other Ambulatory Visit (HOSPITAL_BASED_OUTPATIENT_CLINIC_OR_DEPARTMENT_OTHER): Payer: Commercial Managed Care - PPO

## 2018-06-06 ENCOUNTER — Ambulatory Visit (INDEPENDENT_AMBULATORY_CARE_PROVIDER_SITE_OTHER): Payer: BC Managed Care – PPO | Admitting: Orthopaedic Surgery

## 2018-07-27 ENCOUNTER — Telehealth: Payer: Self-pay | Admitting: Radiology

## 2018-07-27 NOTE — Telephone Encounter (Signed)
Patient called stated that MRI had to be canceled is wanting to try and reschedule. Originally scheduled 05/28/18   auth expired on 06/16/18 Returned call and left message explaining that we will have to resubmit or get extension due to COVID-19. Referral has been sent back into [facility]  Call back [phone]

## 2018-07-29 NOTE — Telephone Encounter (Signed)
Pt has been approved with new authorizations for both exams, [facility] will contact when they are able to schedule MRI again

## 2018-09-22 ENCOUNTER — Other Ambulatory Visit (INDEPENDENT_AMBULATORY_CARE_PROVIDER_SITE_OTHER): Payer: Self-pay | Admitting: Orthopaedic Surgery

## 2018-09-22 DIAGNOSIS — M4807 Spinal stenosis, lumbosacral region: Secondary | ICD-10-CM

## 2018-10-03 ENCOUNTER — Other Ambulatory Visit: Payer: Self-pay

## 2018-10-03 ENCOUNTER — Ambulatory Visit (INDEPENDENT_AMBULATORY_CARE_PROVIDER_SITE_OTHER): Payer: BC Managed Care – PPO

## 2018-10-03 DIAGNOSIS — M4807 Spinal stenosis, lumbosacral region: Secondary | ICD-10-CM | POA: Diagnosis not present

## 2018-10-06 ENCOUNTER — Ambulatory Visit: Payer: Commercial Managed Care - PPO | Admitting: Physician Assistant

## 2018-10-10 ENCOUNTER — Other Ambulatory Visit: Payer: Self-pay

## 2018-10-10 ENCOUNTER — Ambulatory Visit (INDEPENDENT_AMBULATORY_CARE_PROVIDER_SITE_OTHER): Payer: BC Managed Care – PPO | Admitting: Physician Assistant

## 2018-10-10 DIAGNOSIS — M48061 Spinal stenosis, lumbar region without neurogenic claudication: Secondary | ICD-10-CM

## 2018-10-10 DIAGNOSIS — M5442 Lumbago with sciatica, left side: Secondary | ICD-10-CM

## 2018-10-10 DIAGNOSIS — M1612 Unilateral primary osteoarthritis, left hip: Secondary | ICD-10-CM | POA: Diagnosis not present

## 2018-10-10 DIAGNOSIS — M25552 Pain in left hip: Secondary | ICD-10-CM

## 2018-10-10 DIAGNOSIS — G8929 Other chronic pain: Secondary | ICD-10-CM

## 2018-10-10 NOTE — Progress Notes (Signed)
Office Visit Note   Patient: Autumn Lindsey           Date of Birth: 05-May-1950           MRN: [id number] Visit Date: 10/10/2018              Requested by: Concepcion Elk, MD [address] [place],  [place] [address] PCP: Concepcion Elk, MD   Assessment & Plan: Visit Diagnoses:  1. Primary osteoarthritis of left hip   2. Lumbar foraminal stenosis     Plan: At this point time patient would like to try any conservative treatment prior to having any type of surgical and intervention on the left hip.  Therefore we will set her up for an intra-articular injection of the left hip.  She feels that she would like to have a left total hip replacement sometime after she retires in a year and a half.  In regards to the radicular symptoms she is having down the left leg would like to have her undergo an epidural steroid injection/foraminal injection at L4-5.  We will set these up for her.  Have her follow-up with [place] on an as-needed basis.  Questions encouraged and answered at length.  Copies of warts were given.  Follow-Up Instructions: Return if symptoms worsen or fail to improve.   Orders:  No orders of the defined types were placed in this encounter.  No orders of the defined types were placed in this encounter.     Procedures: No procedures performed   Clinical Data: No additional findings.   Subjective: Chief Complaint  Patient presents with  . Left Hip - Follow-up    HPI Ms. Autumn Lindsey returns today to go over the MRI of her lumbar spine and also of her left hip.  She continues to have radicular pain tickly down the left leg that radiates down into the calf.  She is also having some right posterior thigh pain that is just became apparent.  In regards to her left hip she is having pain within the groin area. MRI of the lumbar spine dated 10/04/2018 showed an L2-L3 right eccentric disc protrusion which impinges is upon the right L3 nerve root.  At L4-5 right 4 foraminal protrusion with  L4 impingement resulting in mild to moderate foraminal stenosis. MRI of the left hip dated 10/03/2018 showed no avascular necrosis.  No acute fractures.  Moderate to severe osteoarthritis of the left hip with extensive full-thickness cartilage loss involving the femoral head and acetabulum.  Partial interstitial tear of the left rectus for Morris tendon which extended into the myotendinous junction.  MRI images were reviewed with the patient today.  Review of Systems See HPI otherwise negative or noncontributory.  Objective: Vital Signs: There were no vitals taken for this visit.  Physical Exam General: Well-developed well-nourished female no acute distress mood and affect appropriate.  Ambulates without any assistive device.  Able to do a up and down from seated position on her own. Ortho Exam  Specialty Comments:  No specialty comments available.  Imaging: No results found.   PMFS History: Patient Active Problem List   Diagnosis Date Noted  . Trochanteric bursitis, left hip 04/19/2017  . It band syndrome, left 04/19/2017  . Degenerative arthritis of hip 07/29/2012   Past Medical History:  Diagnosis Date  . Anemia   . Arthritis   . Depression   . GERD (gastroesophageal reflux disease)   . Headache(784.0)    in past  . History  of melanoma excision   . PONV (postoperative nausea and vomiting)     No family history on file.  Past Surgical History:  Procedure Laterality Date  . APPENDECTOMY  1970's  . BREAST ENHANCEMENT SURGERY    . SKIN GRAFT  1980's  . TONSILLECTOMY    . TOTAL HIP ARTHROPLASTY Right 07/29/2012   Procedure: RIGHT TOTAL HIP ARTHROPLASTY ANTERIOR APPROACH;  Surgeon: Mcarthur Rossetti, MD;  Location: [facility];  Service: Orthopedics;  Laterality: Right;   Social History   Occupational History  . Not on file  Tobacco Use  . Smoking status: Never Smoker  Substance and Sexual Activity  . Alcohol use: Yes    Comment: occasional  . Drug use: No  .  Sexual activity: Not on file

## 2018-11-23 ENCOUNTER — Ambulatory Visit: Payer: Self-pay

## 2018-11-23 ENCOUNTER — Telehealth: Payer: Self-pay | Admitting: Radiology

## 2018-11-23 ENCOUNTER — Ambulatory Visit (INDEPENDENT_AMBULATORY_CARE_PROVIDER_SITE_OTHER): Payer: BC Managed Care – PPO | Admitting: Physical Medicine and Rehabilitation

## 2018-11-23 ENCOUNTER — Encounter: Payer: Self-pay | Admitting: Physical Medicine and Rehabilitation

## 2018-11-23 DIAGNOSIS — M25552 Pain in left hip: Secondary | ICD-10-CM

## 2018-11-23 DIAGNOSIS — M5416 Radiculopathy, lumbar region: Secondary | ICD-10-CM

## 2018-11-23 DIAGNOSIS — M5116 Intervertebral disc disorders with radiculopathy, lumbar region: Secondary | ICD-10-CM

## 2018-11-23 MED ORDER — BUPIVACAINE HCL 0.25 % IJ SOLN
4.0000 mL | INTRAMUSCULAR | Status: AC | PRN
  Administered 2018-11-23: 13:00:00 4 mL via INTRA_ARTICULAR

## 2018-11-23 MED ORDER — TRIAMCINOLONE ACETONIDE 40 MG/ML IJ SUSP
60.0000 mg | INTRAMUSCULAR | Status: AC | PRN
  Administered 2018-11-23: 13:00:00 60 mg via INTRA_ARTICULAR

## 2018-11-23 NOTE — Telephone Encounter (Signed)
FYI  I scheduled potential left L4 transforaminal injection on 12/08/18 per Dr. Ernestina Patches request. FYI in case authorization is required or if scheduling needs to be adjusted.  Thanks!

## 2018-11-23 NOTE — Progress Notes (Signed)
Numeric Pain Rating Scale and Functional Assessment Average Pain 5   In the last MONTH (on 0-10 scale) has pain interfered with the following?  1. General activity like being  able to carry out your everyday physical activities such as walking, climbing stairs, carrying groceries, or moving a chair?  Rating(2)    -BT, -Dye Allergies.

## 2018-11-23 NOTE — Telephone Encounter (Signed)
Per pt insurance [employer] no PA is needed for [phone] per online portal.

## 2018-11-23 NOTE — Progress Notes (Addendum)
Autumn Lindsey - 68 y.o. female MRN [id number]  Date of birth: November 22, 1950  Office Visit Note: Visit Date: 11/23/2018 PCP: Concepcion Elk, MD Referred by: Concepcion Elk, MD  Subjective: Chief Complaint  Patient presents with  . Left Hip - Pain  . Left Leg - Pain   HPI: Autumn Lindsey is a 68 y.o. female who comes in today For diagnostic and hopefully therapeutic anesthetic hip arthrogram today.  Patient did get pretty outstanding relief with the anesthetic portion of this.  We are going to schedule her for diagnostic left L4 transforaminal injection differentially.  If she is doing well she can cancel that appointment and I think she can continue to follow-up with Dr. Ninfa Linden concerning the fact that this is probably mostly a hip related issue at that point.   Review of Systems  Constitutional: Negative for chills, fever, malaise/fatigue and weight loss.  HENT: Negative for hearing loss and sinus pain.   Eyes: Negative for blurred vision, double vision and photophobia.  Respiratory: Negative for cough and shortness of breath.   Cardiovascular: Negative for chest pain, palpitations and leg swelling.  Gastrointestinal: Negative for abdominal pain, nausea and vomiting.  Genitourinary: Negative for flank pain.  Musculoskeletal: Positive for back pain and joint pain. Negative for myalgias.  Skin: Negative for itching and rash.  Neurological: Negative for tremors, focal weakness and weakness.  Endo/Heme/Allergies: Negative.   Psychiatric/Behavioral: Negative for depression.  All other systems reviewed and are negative.  Otherwise per HPI.  Assessment & Plan: Visit Diagnoses:  1. Pain in left hip   2. Lumbar radiculopathy   3. Radiculopathy due to lumbar intervertebral disc disorder     Plan: Findings:  Symptoms somewhat consistent with both hip pathology on the left as well as potential left L4 radiculopathy do to small disc protrusion.  I think most of her symptoms are from the  hip.  She has had injections in the bursa in the past but no intra-articular injection.  She has had MRI of the left hip showing pretty significant arthritis and changes of the hip.  Stiff with rotation.  More lateral hip pain occasional upper anterior hip pain.  She does get some down the left lateral thigh which is even felt to have been iliotibial band syndrome at some point.  MRI of the lumbar spine does show disc protrusion at L4 in the foramen which could affect the L4 nerve root.  We did complete diagnostic and hopefully therapeutic anesthetic hip arthrogram today.  Patient did get pretty outstanding relief with the anesthetic portion of this.  We are going to schedule her for diagnostic left L4 transforaminal injection differentially.  If she is doing well she can cancel that appointment and I think she can continue to follow-up with Dr. Ninfa Linden concerning the fact that this is probably mostly a hip related issue at that point.    Meds & Orders:  Meds ordered this encounter  Medications  . bupivacaine (MARCAINE) 0.25 % (with pres) injection 4 mL  . triamcinolone acetonide (KENALOG-40) injection 60 mg    Orders Placed This Encounter  Procedures  . Large Joint Inj: L hip joint  . XR C-ARM NO REPORT    Follow-up: Return in about 2 weeks (around 12/07/2018) for Potential for left L4 transforaminal epidural steroid injection.   Procedures: Large Joint Inj: L hip joint on 11/23/2018 1:06 PM Indications: diagnostic evaluation and pain Details: 22 G 3.5 in needle, fluoroscopy-guided anterior approach  Arthrogram: No  Medications: 4 mL bupivacaine 0.25 %; 60 mg triamcinolone acetonide 40 MG/ML Outcome: tolerated well, no immediate complications  There was excellent flow of contrast producing a partial arthrogram of the hip. The patient did have relief of symptoms during the anesthetic phase of the injection. Procedure, treatment alternatives, risks and benefits explained, specific risks  discussed. Consent was given by the patient. Immediately prior to procedure a time out was called to verify the correct patient, procedure, equipment, support staff and site/side marked as required. Patient was prepped and draped in the usual sterile fashion.      No notes on file   Clinical History: MR OF THE LEFT HIP WITHOUT CONTRAST  IMPRESSION: 1. 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 2. Trace left hip joint effusion, nonspecific. 3. Partial-thickness interstitial tear of the left rectus femoris tendon extending into the myotendinous junction with an associated grade 2 muscular injury of the visualized proximal left rectus femoris muscle. 4. Partially evaluated right total hip arthroplasty without obvious abnormality or large periprosthetic fluid collection.   Electronically Signed   By: Davina Poke M.D.   On: 10/04/2018 11:57 -------   She reports that she has never smoked. She does not have any smokeless tobacco history on file. No results for input(s): HGBA1C, LABURIC in the last 8760 hours.  Objective:  VS:  HT:    WT:   BMI:     BP:   HR: bpm  TEMP: ( )  RESP:  Physical Exam Vitals signs and nursing note reviewed.  Constitutional:      General: She is not in acute distress.    Appearance: Normal appearance. She is well-developed. She is not ill-appearing.  HENT:     Head: Normocephalic and atraumatic.  Eyes:     Conjunctiva/sclera: Conjunctivae normal.     Pupils: Pupils are equal, round, and reactive to light.  Cardiovascular:     Rate and Rhythm: Normal rate.     Pulses: Normal pulses.  Pulmonary:     Effort: Pulmonary effort is normal.  Musculoskeletal:     Right lower leg: No edema.     Left lower leg: No edema.     Comments: Patient ambulates without aid somewhat antalgic gait to the left some limitation going from sit to stand quickly.  No  foot drop noted.  Skin:    General: Skin is warm and dry.     Findings: No erythema or rash.  Neurological:     General: No focal deficit present.     Mental Status: She is alert and oriented to person, place, and time.     Sensory: No sensory deficit.     Motor: No abnormal muscle tone.     Coordination: Coordination normal.     Gait: Gait abnormal.  Psychiatric:        Mood and Affect: Mood normal.        Behavior: Behavior normal.     Ortho Exam Imaging: No results found.  Past Medical/Family/Surgical/Social History: Medications & Allergies reviewed per EMR, new medications updated. Patient Active Problem List   Diagnosis Date Noted  . Trochanteric bursitis, left hip 04/19/2017  . It band syndrome, left 04/19/2017  . Degenerative arthritis of hip 07/29/2012   Past Medical History:  Diagnosis Date  . Anemia   . Arthritis   . Depression   . GERD (gastroesophageal reflux disease)   . Headache(784.0)    in past  .  History of melanoma excision   . PONV (postoperative nausea and vomiting)    History reviewed. No pertinent family history. Past Surgical History:  Procedure Laterality Date  . APPENDECTOMY  1970's  . BREAST ENHANCEMENT SURGERY    . SKIN GRAFT  1980's  . TONSILLECTOMY    . TOTAL HIP ARTHROPLASTY Right 07/29/2012   Procedure: RIGHT TOTAL HIP ARTHROPLASTY ANTERIOR APPROACH;  Surgeon: Mcarthur Rossetti, MD;  Location: [facility];  Service: Orthopedics;  Laterality: Right;   Social History   Occupational History  . Not on file  Tobacco Use  . Smoking status: Never Smoker  Substance and Sexual Activity  . Alcohol use: Yes    Comment: occasional  . Drug use: No  . Sexual activity: Not on file

## 2018-12-08 ENCOUNTER — Ambulatory Visit (INDEPENDENT_AMBULATORY_CARE_PROVIDER_SITE_OTHER): Payer: BC Managed Care – PPO | Admitting: Physical Medicine and Rehabilitation

## 2018-12-08 ENCOUNTER — Ambulatory Visit: Payer: Self-pay

## 2018-12-08 ENCOUNTER — Encounter: Payer: Self-pay | Admitting: Physical Medicine and Rehabilitation

## 2018-12-08 VITALS — BP 120/67 | HR 66

## 2018-12-08 DIAGNOSIS — M5416 Radiculopathy, lumbar region: Secondary | ICD-10-CM

## 2018-12-08 NOTE — Progress Notes (Signed)
 .  Numeric Pain Rating Scale and Functional Assessment Average Pain 4   In the last MONTH (on 0-10 scale) has pain interfered with the following?  1. General activity like being  able to carry out your everyday physical activities such as walking, climbing stairs, carrying groceries, or moving a chair?  Rating(4)   +Driver, -BT, -Dye Allergies.  

## 2018-12-31 ENCOUNTER — Encounter (INDEPENDENT_AMBULATORY_CARE_PROVIDER_SITE_OTHER): Payer: Self-pay

## 2019-01-04 ENCOUNTER — Ambulatory Visit: Payer: Commercial Managed Care - PPO | Admitting: Orthopaedic Surgery

## 2019-01-05 DIAGNOSIS — M5416 Radiculopathy, lumbar region: Secondary | ICD-10-CM | POA: Diagnosis not present

## 2019-01-05 MED ORDER — BETAMETHASONE SOD PHOS & ACET 6 (3-3) MG/ML IJ SUSP
12.0000 mg | Freq: Once | INTRAMUSCULAR | Status: AC
  Administered 2019-01-05: 06:00:00 12 mg

## 2019-01-05 NOTE — Progress Notes (Signed)
Autumn Lindsey - 69 y.o. female MRN [id number]  Date of birth: [birth year]  Office Visit Note: Visit Date: 12/08/2018 PCP: Concepcion Elk, MD Referred by: Concepcion Elk, MD  Subjective: Chief Complaint  Patient presents with  . Lower Back - Pain  . Left Leg - Pain   HPI: Autumn Lindsey is a 68 y.o. female who comes in today For potential left L4 transforaminal epidural steroid injection.  Patient is having left lower back pain with pain in the left leg into the thigh for many years.  Prior intra-articular anesthetic hip arthrogram a few weeks ago was definitely beneficial during the anesthetic phase and her pain is a lot less overall.  MRI of the lumbar spine have been performed.  She follows with Dr. Jean Rosenthal in our office from an orthopedic standpoint.  Interestingly more right-sided findings on the MRI some moderate left foraminal narrowing at L4.  We will try diagnostic L4 transforaminal injection.  Differentially, if this is just not very beneficial I think the problem is from her hip.  Her hips do appear a lot worse on imaging.  She will follow up with Dr. Ninfa Linden.  ROS Otherwise per HPI.  Assessment & Plan: Visit Diagnoses:  1. Lumbar radiculopathy     Plan: No additional findings.   Meds & Orders:  Meds ordered this encounter  Medications  . betamethasone acetate-betamethasone sodium phosphate (CELESTONE) injection 12 mg    Orders Placed This Encounter  Procedures  . XR C-ARM NO REPORT  . Epidural Steroid injection    Follow-up: Return if symptoms worsen or fail to improve, for Jean Rosenthal, M.D..   Procedures: No procedures performed  Lumbosacral Transforaminal Epidural Steroid Injection - Sub-Pedicular Approach with Fluoroscopic Guidance  Patient: Autumn Lindsey      Date of Birth: [birth year] MRN: [id number] PCP: Concepcion Elk, MD      Visit Date: 12/08/2018   Universal Protocol:    Date/Time: 12/08/2018  Consent Given By: the patient   Position: PRONE  Additional Comments: Vital signs were monitored before and after the procedure. Patient was prepped and draped in the usual sterile fashion. The correct patient, procedure, and site was verified.   Injection Procedure Details:  Procedure Site One Meds Administered:  Meds ordered this encounter  Medications  . betamethasone acetate-betamethasone sodium phosphate (CELESTONE) injection 12 mg    Laterality: Left  Location/Site:  L4-L5  Needle size: 22 G  Needle type: Spinal  Needle Placement: Transforaminal  Findings:    -Comments: Excellent flow of contrast along the nerve and into the epidural space.  Procedure Details: After squaring off the end-plates to get a true AP view, the C-arm was positioned so that an oblique view of the foramen as noted above was visualized. The target area is just inferior to the "nose of the scotty dog" or sub pedicular. The soft tissues overlying this structure were infiltrated with 2-3 ml. of 1% Lidocaine without Epinephrine.  The spinal needle was inserted toward the target using a "trajectory" view along the fluoroscope beam.  Under AP and lateral visualization, the needle was advanced so it did not puncture dura and was located close the 6 O'Clock position of the pedical in AP tracterory. Biplanar projections were used to confirm position. Aspiration was confirmed to be negative for CSF and/or blood. A 1-2 ml. volume of Isovue-250 was injected and flow of contrast was noted at each level. Radiographs were obtained for documentation purposes.   After attaining the desired  flow of contrast documented above, a 0.5 to 1.0 ml test dose of 0.25% Marcaine was injected into each respective transforaminal space.  The patient was observed for 90 seconds post injection.  After no sensory deficits were reported, and normal lower extremity motor function was noted,   the above injectate was administered so that equal amounts of the injectate  were placed at each foramen (level) into the transforaminal epidural space.   Additional Comments:  The patient tolerated the procedure well Dressing: 2 x 2 sterile gauze and Band-Aid    Post-procedure details: Patient was observed during the procedure. Post-procedure instructions were reviewed.  Patient left the clinic in stable condition.     Clinical History: MRI LUMBAR SPINE WITHOUT CONTRAST  TECHNIQUE: Multiplanar, multisequence MR imaging of the lumbar spine was performed. No intravenous contrast was administered.  COMPARISON:  None.  FINDINGS: Segmentation:  5 lumbar type vertebrae  Alignment:  Mild levocurvature.  Slight L2-3 anterolisthesis.  Vertebrae: Mild discogenic endplate edema at 075-GRM. Chronic L4 superior endplate fracture and multiple chronic Schmorl's nodes.  Conus medullaris and cauda equina: Conus extends to the T12 level. Conus and cauda equina appear normal.  Paraspinal and other soft tissues: Left renal cystic intensities.  Disc levels:  T12- L1: Unremarkable.  L1-L2: Unremarkable.  L2-L3: Greatest level of degenerative disc narrowing with desiccation and rightward bulging. Right paracentral to foraminal protrusion with mild noncompressive spinal stenosis. There is right subarticular recess narrowing that could affect the descending L3 nerve root.  L3-L4: Disc narrowing and bulging. Mild posterior element hypertrophy. Mild spinal stenosis with left more than right subarticular recess narrowing that does not cause static compression of L4. Patent foramina.  L4-L5: Disc narrowing and bulging with a right foraminal protrusion impinging on the L4 nerve root. Left foraminal narrowing is mild to moderate  L5-S1:Unremarkable.  IMPRESSION: 1. L2-3 right eccentric disc protrusion which impinges on the descending right L3 nerve root. 2. L4-5 right foraminal protrusion with L4 impingement. Left foraminal narrowing at this  level is mild-to-moderate.   Electronically Signed   By: Monte Fantasia M.D.   On: 10/04/2018 11:55 ------- MR OF THE LEFT HIP WITHOUT CONTRAST  IMPRESSION: 1. 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 2. Trace left hip joint effusion, nonspecific. 3. Partial-thickness interstitial tear of the left rectus femoris tendon extending into the myotendinous junction with an associated grade 2 muscular injury of the visualized proximal left rectus femoris muscle. 4. Partially evaluated right total hip arthroplasty without obvious abnormality or large periprosthetic fluid collection.   Electronically Signed By: Davina Poke M.D. On: 10/04/2018 11:57 -------   She reports that she has never smoked. She does not have any smokeless tobacco history on file. No results for input(s): HGBA1C, LABURIC in the last 8760 hours.  Objective:  VS:  HT:    WT:   BMI:     BP:120/67  HR:66bpm  TEMP: ( )  RESP:  Physical Exam  Ortho Exam Imaging: No results found.  Past Medical/Family/Surgical/Social History: Medications & Allergies reviewed per EMR, new medications updated. Patient Active Problem List   Diagnosis Date Noted  . Trochanteric bursitis, left hip 04/19/2017  . It band syndrome, left 04/19/2017  . Degenerative arthritis of hip 07/29/2012   Past Medical History:  Diagnosis Date  . Anemia   . Arthritis   . Depression   . GERD (gastroesophageal reflux disease)   . Headache(784.0)    in past  .  History of melanoma excision   . PONV (postoperative nausea and vomiting)    History reviewed. No pertinent family history. Past Surgical History:  Procedure Laterality Date  . APPENDECTOMY  1970's  . BREAST ENHANCEMENT SURGERY    . SKIN GRAFT  1980's  . TONSILLECTOMY    . TOTAL HIP ARTHROPLASTY Right 07/29/2012   Procedure: RIGHT TOTAL HIP ARTHROPLASTY ANTERIOR APPROACH;   Surgeon: Mcarthur Rossetti, MD;  Location: [facility];  Service: Orthopedics;  Laterality: Right;   Social History   Occupational History  . Not on file  Tobacco Use  . Smoking status: Never Smoker  Substance and Sexual Activity  . Alcohol use: Yes    Comment: occasional  . Drug use: No  . Sexual activity: Not on file

## 2019-01-05 NOTE — Procedures (Signed)
Lumbosacral Transforaminal Epidural Steroid Injection - Sub-Pedicular Approach with Fluoroscopic Guidance  Patient: Autumn Lindsey      Date of Birth: 04-May-1950 MRN: [id number] PCP: Concepcion Elk, MD      Visit Date: 12/08/2018   Universal Protocol:    Date/Time: 12/08/2018  Consent Given By: the patient  Position: PRONE  Additional Comments: Vital signs were monitored before and after the procedure. Patient was prepped and draped in the usual sterile fashion. The correct patient, procedure, and site was verified.   Injection Procedure Details:  Procedure Site One Meds Administered:  Meds ordered this encounter  Medications  . betamethasone acetate-betamethasone sodium phosphate (CELESTONE) injection 12 mg    Laterality: Left  Location/Site:  L4-L5  Needle size: 22 G  Needle type: Spinal  Needle Placement: Transforaminal  Findings:    -Comments: Excellent flow of contrast along the nerve and into the epidural space.  Procedure Details: After squaring off the end-plates to get a true AP view, the C-arm was positioned so that an oblique view of the foramen as noted above was visualized. The target area is just inferior to the "nose of the scotty dog" or sub pedicular. The soft tissues overlying this structure were infiltrated with 2-3 ml. of 1% Lidocaine without Epinephrine.  The spinal needle was inserted toward the target using a "trajectory" view along the fluoroscope beam.  Under AP and lateral visualization, the needle was advanced so it did not puncture dura and was located close the 6 O'Clock position of the pedical in AP tracterory. Biplanar projections were used to confirm position. Aspiration was confirmed to be negative for CSF and/or blood. A 1-2 ml. volume of Isovue-250 was injected and flow of contrast was noted at each level. Radiographs were obtained for documentation purposes.   After attaining the desired flow of contrast documented above, a 0.5 to  1.0 ml test dose of 0.25% Marcaine was injected into each respective transforaminal space.  The patient was observed for 90 seconds post injection.  After no sensory deficits were reported, and normal lower extremity motor function was noted,   the above injectate was administered so that equal amounts of the injectate were placed at each foramen (level) into the transforaminal epidural space.   Additional Comments:  The patient tolerated the procedure well Dressing: 2 x 2 sterile gauze and Band-Aid    Post-procedure details: Patient was observed during the procedure. Post-procedure instructions were reviewed.  Patient left the clinic in stable condition.

## 2019-01-10 ENCOUNTER — Ambulatory Visit (INDEPENDENT_AMBULATORY_CARE_PROVIDER_SITE_OTHER): Payer: BC Managed Care – PPO | Admitting: Orthopaedic Surgery

## 2019-01-10 ENCOUNTER — Encounter: Payer: Self-pay | Admitting: Orthopaedic Surgery

## 2019-01-10 DIAGNOSIS — M1612 Unilateral primary osteoarthritis, left hip: Secondary | ICD-10-CM

## 2019-01-10 NOTE — Progress Notes (Signed)
The patient is well-known to me.  She has significant arthritis in her left hip but it was identified on plain films and mainly MRI.  She is 6 years out from a right total hip arthroplasty.  At this point she is tried and failed conservative treatment for over a year for her left hip.  Her pain is daily and is detrimentally affecting her mobility, her quality of life, and her actives of daily living.  We have tried intra-articular injections as well as activity modification and time.  She is work on hip strengthening exercises.  At this point she does wish proceed with surgery.  Her pain can be 10 out of 10 at times.  We have also been dealing with lumbar spine issues with her.  On examination of her left hip there is severe pain with internal and external rotation.  Her plain films and MRI reviewed again and show the extent of her significant arthritis involving her left hip.  Given the clinical exam findings combined with her x-ray findings and the failure conservative treatment I do feel it is necessary at this point to proceed with hip replacement surgery as does she.  Having had this done before she is fully aware what the surgery involves.  We talked about her interoperative and postoperative course.  We talked about the risk and benefits of surgery in detail.  She is otherwise a healthy individual.  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  She would like to have the surgery just after Thanksgiving break if soon as possible after that and so she can rehabilitate her hip before the spring semester starts in mid January.

## 2019-01-13 ENCOUNTER — Encounter: Payer: Self-pay | Admitting: Orthopaedic Surgery

## 2019-01-23 ENCOUNTER — Other Ambulatory Visit: Payer: Self-pay

## 2019-01-31 ENCOUNTER — Other Ambulatory Visit: Payer: Self-pay | Admitting: Physician Assistant

## 2019-02-07 ENCOUNTER — Other Ambulatory Visit (HOSPITAL_COMMUNITY)
Admission: RE | Admit: 2019-02-07 | Discharge: 2019-02-07 | Disposition: A | Discharge status: Home / Self Care | Payer: BC Managed Care – PPO | Source: Ambulatory Visit | Attending: Orthopaedic Surgery | Admitting: Orthopaedic Surgery

## 2019-02-07 DIAGNOSIS — Z20828 Contact with and (suspected) exposure to other viral communicable diseases: Secondary | ICD-10-CM | POA: Insufficient documentation

## 2019-02-07 DIAGNOSIS — Z01812 Encounter for preprocedural laboratory examination: Secondary | ICD-10-CM | POA: Diagnosis not present

## 2019-02-07 NOTE — Patient Instructions (Signed)
DUE TO COVID-19 ONLY ONE VISITOR IS ALLOWED TO COME WITH YOU AND STAY IN THE WAITING ROOM ONLY DURING PRE OP AND PROCEDURE DAY OF SURGERY. THE 1 VISITOR MAY VISIT WITH YOU AFTER SURGERY IN YOUR PRIVATE ROOM DURING VISITING HOURS ONLY!  ONCE YOUR COVID TEST IS COMPLETED, PLEASE BEGIN THE QUARANTINE INSTRUCTIONS AS OUTLINED IN YOUR HANDOUT.                Loren Racer    Your procedure is scheduled on: 02/10/19   Report to [facility] Main  Entrance   Report to admitting at  9:45 AM     Call this number if you have problems the morning of surgery [phone]    . BRUSH YOUR TEETH MORNING OF SURGERY AND RINSE YOUR MOUTH OUT, NO CHEWING GUM CANDY OR MINTS.   Do not eat food After Midnight.   YOU MAY HAVE CLEAR LIQUIDS FROM MIDNIGHT UNTIL 9:00 AM    CLEAR LIQUID DIET   Foods Allowed                                                                     Foods Excluded  Coffee and tea, regular and decaf                             liquids that you cannot  Plain Jell-O any favor except red or purple                                           see through such as: Fruit ices (not with fruit pulp)                                     milk, soups, orange juice  Iced Popsicles                                    All solid food Carbonated beverages, regular and diet                                    Cranberry, grape and apple juices Sports drinks like Gatorade Lightly seasoned clear broth or consume(fat free) Sugar, honey syrup    . At 9:00 AM Please finish the prescribed Pre-Surgery drink  . Nothing by mouth after you finish the  drink !    Take these medicines the morning of surgery with A SIP OF WATER: none                                 You may not have any metal on your body including hair pins and              piercings  Do not wear jewelry, make-up, lotions, powders or perfumes, deodorant  Do not wear nail polish on your fingernails.  Do not shave  48  hours prior to surgery.      Do not bring valuables to the hospital. [place]  Contacts, dentures or bridgework may not be worn into surgery.                    Please read over the following fact sheets you were given: _____________________________________________________________________             [facility] - Preparing for Surgery  Before surgery, you can play an important role.   Because skin is not sterile, your skin needs to be as free of germs as possible.   You can reduce the number of germs on your skin by washing with CHG (chlorahexidine gluconate) soap before surgery.   CHG is an antiseptic cleaner which kills germs and bonds with the skin to continue killing germs even after washing. Please DO NOT use if you have an allergy to CHG or antibacterial soaps.   If your skin becomes reddened/irritated stop using the CHG and inform your nurse when you arrive at Short Stay. Do not shave (including legs and underarms) for at least 48 hours prior to the first CHG shower.   Please follow these instructions carefully:  1.  Shower with CHG Soap the night before surgery and the  morning of Surgery.  2.  If you choose to wash your hair, wash your hair first as usual with your  normal  shampoo.  3.  After you shampoo, rinse your hair and body thoroughly to remove the  shampoo.                                        4.  Use CHG as you would any other liquid soap.  You can apply chg directly  to the skin and wash                       Gently with a scrungie or clean washcloth.  5.  Apply the CHG Soap to your body ONLY FROM THE NECK DOWN.   Do not use on face/ open                           Wound or open sores. Avoid contact with eyes, ears mouth and genitals (private parts).                       Wash face,  Genitals (private parts) with your normal soap.             6.  Wash thoroughly, paying special attention to the area where your  surgery  will be performed.  7.  Thoroughly rinse your body with warm water from the neck down.  8.  DO NOT shower/wash with your normal soap after using and rinsing off  the CHG Soap.             9.  Pat yourself dry with a clean towel.            10.  Wear clean pajamas.            11.  Place clean sheets on  your bed the night of your first shower and do not  sleep with pets. Day of Surgery : Do not apply any lotions/deodorants the morning of surgery.  Please wear clean clothes to the hospital/surgery center.  FAILURE TO FOLLOW THESE INSTRUCTIONS MAY RESULT IN THE CANCELLATION OF YOUR SURGERY PATIENT SIGNATURE_________________________________  NURSE SIGNATURE__________________________________  ________________________________________________________________________   Adam Phenix  An incentive spirometer is a tool that can help keep your lungs clear and active. This tool measures how well you are filling your lungs with each breath. Taking long deep breaths may help reverse or decrease the chance of developing breathing (pulmonary) problems (especially infection) following:  A long period of time when you are unable to move or be active. BEFORE THE PROCEDURE   If the spirometer includes an indicator to show your best effort, your nurse or respiratory therapist will set it to a desired goal.  If possible, sit up straight or lean slightly forward. Try not to slouch.  Hold the incentive spirometer in an upright position. INSTRUCTIONS FOR USE  1. Sit on the edge of your bed if possible, or sit up as far as you can in bed or on a chair. 2. Hold the incentive spirometer in an upright position. 3. Breathe out normally. 4. Place the mouthpiece in your mouth and seal your lips tightly around it. 5. Breathe in slowly and as deeply as possible, raising the piston or the ball toward the top of the column. 6. Hold your breath for 3-5 seconds or for as long as possible. Allow the  piston or ball to fall to the bottom of the column. 7. Remove the mouthpiece from your mouth and breathe out normally. 8. Rest for a few seconds and repeat Steps 1 through 7 at least 10 times every 1-2 hours when you are awake. Take your time and take a few normal breaths between deep breaths. 9. The spirometer may include an indicator to show your best effort. Use the indicator as a goal to work toward during each repetition. 10. After each set of 10 deep breaths, practice coughing to be sure your lungs are clear. If you have an incision (the cut made at the time of surgery), support your incision when coughing by placing a pillow or rolled up towels firmly against it. Once you are able to get out of bed, walk around indoors and cough well. You may stop using the incentive spirometer when instructed by your caregiver.  RISKS AND COMPLICATIONS  Take your time so you do not get dizzy or light-headed.  If you are in pain, you may need to take or ask for pain medication before doing incentive spirometry. It is harder to take a deep breath if you are having pain. AFTER USE  Rest and breathe slowly and easily.  It can be helpful to keep track of a log of your progress. Your caregiver can provide you with a simple table to help with this. If you are using the spirometer at home, follow these instructions: [place] IF:   You are having difficultly using the spirometer.  You have trouble using the spirometer as often as instructed.  Your pain medication is not giving enough relief while using the spirometer.  You develop fever of 100.5 F (38.1 C) or higher. SEEK IMMEDIATE MEDICAL CARE IF:   You cough up bloody sputum that had not been present before.  You develop fever of 102 F (38.9 C) or greater.  You develop worsening pain  at or near the incision site. MAKE SURE YOU:   Understand these instructions.  Will watch your condition.  Will get help right away if you are not  doing well or get worse. Document Released: 07/06/2006 Document Revised: 05/18/2011 Document Reviewed: 09/06/2006 [facility] Patient Information 2014 [place], [place].   ________________________________________________________________________

## 2019-02-08 ENCOUNTER — Encounter (HOSPITAL_COMMUNITY): Payer: Self-pay

## 2019-02-08 ENCOUNTER — Other Ambulatory Visit: Payer: Self-pay

## 2019-02-08 ENCOUNTER — Encounter (HOSPITAL_COMMUNITY)
Admission: RE | Admit: 2019-02-08 | Discharge: 2019-02-08 | Disposition: A | Discharge status: Home / Self Care | Payer: BC Managed Care – PPO | Source: Ambulatory Visit | Attending: Orthopaedic Surgery | Admitting: Orthopaedic Surgery

## 2019-02-08 DIAGNOSIS — Z01812 Encounter for preprocedural laboratory examination: Secondary | ICD-10-CM | POA: Insufficient documentation

## 2019-02-08 LAB — BASIC METABOLIC PANEL
Anion gap: 8 (ref 5–15)
BUN: 25 mg/dL — ABNORMAL HIGH (ref 8–23)
CO2: 26 mmol/L (ref 22–32)
Calcium: 9 mg/dL (ref 8.9–10.3)
Chloride: 105 mmol/L (ref 98–111)
Creatinine, Ser: 0.91 mg/dL (ref 0.44–1.00)
GFR calc Af Amer: >60 mL/min (ref >60)
GFR calc non Af Amer: >60 mL/min (ref >60)
Glucose, Bld: 99 mg/dL (ref 70–99)
Potassium: 4.2 mmol/L (ref 3.5–5.1)
Sodium: 139 mmol/L (ref 135–145)

## 2019-02-08 LAB — CBC
HCT: 38.8 % (ref 36.0–46.0)
Hemoglobin: 12.4 g/dL (ref 12.0–15.0)
MCH: 31.2 pg (ref 26.0–34.0)
MCHC: 32 g/dL (ref 30.0–36.0)
MCV: 97.5 fL (ref 80.0–100.0)
Platelets: 250 10*3/uL (ref 150–400)
RBC: 3.98 MIL/uL (ref 3.87–5.11)
RDW: 12.2 % (ref 11.5–15.5)
WBC: 6.3 10*3/uL (ref 4.0–10.5)
nRBC: 0 % (ref 0.0–0.2)

## 2019-02-08 LAB — SURGICAL PCR SCREEN
MRSA, PCR: NEGATIVE
Staphylococcus aureus: NEGATIVE

## 2019-02-08 NOTE — Progress Notes (Signed)
PCP - Dr. Kem Parkinson Cardiologist -none   Chest x-ray - no EKG - no Stress Test - no ECHO -no  Cardiac Cath - no  Sleep Study - no CPAP -   Fasting Blood Sugar - NA Checks Blood Sugar _____ times a day  Blood Thinner Instructions: NA Aspirin Instructions: Last Dose:  Anesthesia review:   Patient denies shortness of breath, fever, cough and chest pain at PAT appointment yes  Patient verbalized understanding of instructions that were given to them at the PAT appointment. Patient was also instructed that they will need to review over the PAT instructions again at home before surgery. yes

## 2019-02-09 ENCOUNTER — Encounter (HOSPITAL_COMMUNITY): Payer: Self-pay | Admitting: Orthopaedic Surgery

## 2019-02-09 ENCOUNTER — Telehealth: Payer: Self-pay | Admitting: Orthopaedic Surgery

## 2019-02-09 DIAGNOSIS — M1612 Unilateral primary osteoarthritis, left hip: Secondary | ICD-10-CM

## 2019-02-09 HISTORY — DX: Unilateral primary osteoarthritis, left hip: M16.12

## 2019-02-09 LAB — NOVEL CORONAVIRUS, NAA (HOSP ORDER, SEND-OUT TO REF LAB; TAT 18-24 HRS): SARS-CoV-2, NAA: NOT DETECTED

## 2019-02-09 NOTE — Telephone Encounter (Signed)
Patient aware they will set this up for her

## 2019-02-09 NOTE — Telephone Encounter (Signed)
Patient is having surgery tomorrow with Dr. Ninfa Linden and has not heard anything about PT.  She would like for you to give her a call.  CB#[phone].   Thank you.

## 2019-02-09 NOTE — H&P (Signed)
TOTAL HIP ADMISSION H&P  Patient is admitted for left total hip arthroplasty.  Subjective:  Chief Complaint: left hip pain  HPI: Autumn Lindsey, 68 y.o. female, has a history of pain and functional disability in the left hip(s) due to arthritis and patient has failed non-surgical conservative treatments for greater than 12 weeks to include NSAID's and/or analgesics, corticosteriod injections, flexibility and strengthening excercises and activity modification.  Onset of symptoms was gradual starting 3 years ago with gradually worsening course since that time.The patient noted no past surgery on the left hip(s).  Patient currently rates pain in the left hip at 10 out of 10 with activity. Patient has night pain, worsening of pain with activity and weight bearing, pain that interfers with activities of daily living and pain with passive range of motion. Patient has evidence of subchondral cysts, subchondral sclerosis, periarticular osteophytes and joint space narrowing by imaging studies. This condition presents safety issues increasing the risk of falls.  There is no current active infection.  Patient Active Problem List   Diagnosis Date Noted  . Unilateral primary osteoarthritis, left hip 02/09/2019  . Trochanteric bursitis, left hip 04/19/2017  . It band syndrome, left 04/19/2017  . Degenerative arthritis of hip 07/29/2012   Past Medical History:  Diagnosis Date  . Anemia   . Arthritis   . Depression   . GERD (gastroesophageal reflux disease)   . Headache(784.0)    in past  . History of melanoma excision   . PONV (postoperative nausea and vomiting)   . Unilateral primary osteoarthritis, left hip 02/09/2019    Past Surgical History:  Procedure Laterality Date  . APPENDECTOMY  1970's  . BREAST ENHANCEMENT SURGERY    . SKIN GRAFT  1980's  . TONSILLECTOMY    . TOTAL HIP ARTHROPLASTY Right 07/29/2012   Procedure: RIGHT TOTAL HIP ARTHROPLASTY ANTERIOR APPROACH;  Surgeon: Mcarthur Rossetti, MD;  Location: [facility] ORS;  Service: Orthopedics;  Laterality: Right;    No current facility-administered medications for this encounter.    Current Outpatient Medications  Medication Sig Dispense Refill Last Dose  . acetaminophen (TYLENOL) 325 MG tablet Take 650 mg by mouth every 6 (six) hours as needed (pain).     Marland Kitchen atorvastatin (LIPITOR) 20 MG tablet Take 20 mg by mouth at bedtime.      . Calcium Carbonate-Vitamin D (CALCIUM 600 + D PO) Take 1 tablet by mouth daily.     . cholecalciferol (VITAMIN D3) 25 MCG (1000 UT) tablet Take 1,000 Units by mouth daily.     Marland Kitchen estradiol-norethindrone (ACTIVELLA) 1-0.5 MG per tablet Take 1 tablet by mouth daily.     . fish oil-omega-3 fatty acids 1000 MG capsule Take 1 g by mouth daily.      Marland Kitchen ibuprofen (ADVIL) 200 MG tablet Take 600 mg by mouth every 8 (eight) hours as needed (pain).      . minoxidil (ROGAINE) 2 % external solution Apply 1 application topically daily.      . Multiple Vitamins-Minerals (PRESERVISION AREDS 2) CAPS Take 1 capsule by mouth daily.      . Pediatric Multivitamins-Iron (FLINTSTONES COMPLETE) 18 MG CHEW Chew 1 tablet by mouth daily.      . SUMAtriptan (IMITREX) 50 MG tablet Take 50 mg by mouth every 2 (two) hours as needed for migraine.      . vitamin E 400 UNIT capsule Take 400 Units by mouth daily.      No Known Allergies  Social History  Tobacco Use  . Smoking status: Never Smoker  . Smokeless tobacco: Never Used  Substance Use Topics  . Alcohol use: Yes    Comment: occasional    No family history on file.   Review of Systems  Musculoskeletal: Positive for joint pain.  All other systems reviewed and are negative.   Objective:  Physical Exam  Constitutional: She is oriented to person, place, and time. She appears well-developed and well-nourished.  HENT:  Head: Normocephalic and atraumatic.  Eyes: Pupils are equal, round, and reactive to light. EOM are normal.  Neck: Normal range of motion. Neck  supple.  Cardiovascular: Normal rate and regular rhythm.  Respiratory: Effort normal and breath sounds normal.  GI: Soft. Bowel sounds are normal.  Musculoskeletal:     Left hip: She exhibits decreased range of motion, decreased strength, tenderness and bony tenderness.  Neurological: She is alert and oriented to person, place, and time.  Skin: Skin is warm and dry.  Psychiatric: She has a normal mood and affect.    Vital signs in last 24 hours:    Labs:   Estimated body mass index is 23.76 kg/m as calculated from the following:   Height as of 02/08/19: 5' 7.5" (1.715 m).   Weight as of 02/08/19: 69.9 kg.   Imaging Review Plain radiographs demonstrate severe degenerative joint disease of the left hip(s). The bone quality appears to be excellent for age and reported activity level.      Assessment/Plan:  End stage arthritis, left hip(s)  The patient history, physical examination, clinical judgement of the provider and imaging studies are consistent with end stage degenerative joint disease of the left hip(s) and total hip arthroplasty is deemed medically necessary. The treatment options including medical management, injection therapy, arthroscopy and arthroplasty were discussed at length. The risks and benefits of total hip arthroplasty were presented and reviewed. The risks due to aseptic loosening, infection, stiffness, dislocation/subluxation,  thromboembolic complications and other imponderables were discussed.  The patient acknowledged the explanation, agreed to proceed with the plan and consent was signed. Patient is being admitted for inpatient treatment for surgery, pain control, PT, OT, prophylactic antibiotics, VTE prophylaxis, progressive ambulation and ADL's and discharge planning.The patient is planning to be discharged home with home health services

## 2019-02-10 ENCOUNTER — Encounter (HOSPITAL_COMMUNITY)
Admission: RE | Disposition: A | Discharge status: Home Health | Payer: Self-pay | Source: Home / Self Care | Attending: Orthopaedic Surgery

## 2019-02-10 ENCOUNTER — Inpatient Hospital Stay (HOSPITAL_COMMUNITY): Payer: BC Managed Care – PPO

## 2019-02-10 ENCOUNTER — Inpatient Hospital Stay (HOSPITAL_COMMUNITY)
Admission: RE | Admit: 2019-02-10 | Discharge: 2019-02-11 | DRG: 470 | Disposition: A | Discharge status: Home Health | Payer: BC Managed Care – PPO | Attending: Orthopaedic Surgery | Admitting: Orthopaedic Surgery

## 2019-02-10 ENCOUNTER — Other Ambulatory Visit: Payer: Self-pay

## 2019-02-10 ENCOUNTER — Inpatient Hospital Stay (HOSPITAL_COMMUNITY): Payer: BC Managed Care – PPO | Admitting: Certified Registered"

## 2019-02-10 ENCOUNTER — Encounter (HOSPITAL_COMMUNITY): Payer: Self-pay

## 2019-02-10 ENCOUNTER — Inpatient Hospital Stay (HOSPITAL_COMMUNITY): Payer: BC Managed Care – PPO | Admitting: Physician Assistant

## 2019-02-10 DIAGNOSIS — M1612 Unilateral primary osteoarthritis, left hip: Principal | ICD-10-CM

## 2019-02-10 DIAGNOSIS — Z79899 Other long term (current) drug therapy: Secondary | ICD-10-CM

## 2019-02-10 DIAGNOSIS — Z96642 Presence of left artificial hip joint: Secondary | ICD-10-CM

## 2019-02-10 DIAGNOSIS — Z96641 Presence of right artificial hip joint: Secondary | ICD-10-CM | POA: Diagnosis present

## 2019-02-10 DIAGNOSIS — Z9181 History of falling: Secondary | ICD-10-CM | POA: Diagnosis not present

## 2019-02-10 DIAGNOSIS — Z96649 Presence of unspecified artificial hip joint: Secondary | ICD-10-CM

## 2019-02-10 DIAGNOSIS — Z9089 Acquired absence of other organs: Secondary | ICD-10-CM

## 2019-02-10 DIAGNOSIS — Z9049 Acquired absence of other specified parts of digestive tract: Secondary | ICD-10-CM

## 2019-02-10 DIAGNOSIS — Z419 Encounter for procedure for purposes other than remedying health state, unspecified: Secondary | ICD-10-CM

## 2019-02-10 DIAGNOSIS — Z79890 Hormone replacement therapy (postmenopausal): Secondary | ICD-10-CM | POA: Diagnosis not present

## 2019-02-10 HISTORY — PX: TOTAL HIP ARTHROPLASTY: SHX124

## 2019-02-10 SURGERY — ARTHROPLASTY, HIP, TOTAL, ANTERIOR APPROACH
Anesthesia: Spinal | Site: Hip | Laterality: Left

## 2019-02-10 MED ORDER — METOCLOPRAMIDE HCL 5 MG/ML IJ SOLN: 5.0000 mg | Freq: Three times a day (TID) | INTRAMUSCULAR | Status: DC | PRN

## 2019-02-10 MED ORDER — GABAPENTIN 100 MG PO CAPS
100.0000 mg | ORAL_CAPSULE | Freq: Three times a day (TID) | ORAL | Status: DC
  Administered 2019-02-10 – 2019-02-11 (×3): 100 mg via ORAL
  Filled 2019-02-10 (×3): qty 1

## 2019-02-10 MED ORDER — ATORVASTATIN CALCIUM 20 MG PO TABS
20.0000 mg | ORAL_TABLET | Freq: Every day | ORAL | Status: DC
  Administered 2019-02-10: 20 mg via ORAL
  Filled 2019-02-10: qty 1

## 2019-02-10 MED ORDER — HYDROMORPHONE HCL 1 MG/ML IJ SOLN: 0.2500 mg | INTRAMUSCULAR | Status: DC | PRN

## 2019-02-10 MED ORDER — DIPHENHYDRAMINE HCL 12.5 MG/5ML PO ELIX: 12.5000 mg | ORAL_SOLUTION | ORAL | Status: DC | PRN

## 2019-02-10 MED ORDER — ONDANSETRON HCL 4 MG/2ML IJ SOLN
INTRAMUSCULAR | Status: DC | PRN
  Administered 2019-02-10: 4 mg via INTRAVENOUS

## 2019-02-10 MED ORDER — POVIDONE-IODINE 10 % EX SWAB
2.0000 "application " | Freq: Once | CUTANEOUS | Status: AC
  Administered 2019-02-10: 2 via TOPICAL

## 2019-02-10 MED ORDER — PROPOFOL 10 MG/ML IV BOLUS
INTRAVENOUS | Status: AC
  Filled 2019-02-10: qty 20

## 2019-02-10 MED ORDER — ACETAMINOPHEN 325 MG PO TABS: 325.0000 mg | ORAL_TABLET | Freq: Four times a day (QID) | ORAL | Status: DC | PRN

## 2019-02-10 MED ORDER — MENTHOL 3 MG MT LOZG: 1.0000 | LOZENGE | OROMUCOSAL | Status: DC | PRN

## 2019-02-10 MED ORDER — ESTRADIOL-NORETHINDRONE ACET 1-0.5 MG PO TABS: 1.0000 | ORAL_TABLET | Freq: Every day | ORAL | Status: DC

## 2019-02-10 MED ORDER — KETOROLAC TROMETHAMINE 15 MG/ML IJ SOLN
7.5000 mg | Freq: Four times a day (QID) | INTRAMUSCULAR | Status: DC
  Administered 2019-02-10 – 2019-02-11 (×3): 7.5 mg via INTRAVENOUS
  Filled 2019-02-10 (×3): qty 1

## 2019-02-10 MED ORDER — PHENOL 1.4 % MT LIQD: 1.0000 | OROMUCOSAL | Status: DC | PRN

## 2019-02-10 MED ORDER — DEXAMETHASONE SODIUM PHOSPHATE 10 MG/ML IJ SOLN
INTRAMUSCULAR | Status: AC
  Filled 2019-02-10: qty 1

## 2019-02-10 MED ORDER — ONDANSETRON HCL 4 MG/2ML IJ SOLN: 4.0000 mg | Freq: Four times a day (QID) | INTRAMUSCULAR | Status: DC | PRN

## 2019-02-10 MED ORDER — VITAMIN E 180 MG (400 UNIT) PO CAPS
400.0000 [IU] | ORAL_CAPSULE | Freq: Every day | ORAL | Status: DC
  Administered 2019-02-11: 400 [IU] via ORAL
  Filled 2019-02-10: qty 1

## 2019-02-10 MED ORDER — STERILE WATER FOR IRRIGATION IR SOLN
Status: DC | PRN
  Administered 2019-02-10: 1000 mL

## 2019-02-10 MED ORDER — SODIUM CHLORIDE 0.9 % IR SOLN
Status: DC | PRN
  Administered 2019-02-10: 1000 mL

## 2019-02-10 MED ORDER — LACTATED RINGERS IV SOLN
INTRAVENOUS | Status: DC
  Administered 2019-02-10 (×2): via INTRAVENOUS

## 2019-02-10 MED ORDER — EPHEDRINE SULFATE-NACL 50-0.9 MG/10ML-% IV SOSY
PREFILLED_SYRINGE | INTRAVENOUS | Status: DC | PRN
  Administered 2019-02-10: 10 mg via INTRAVENOUS
  Administered 2019-02-10: 5 mg via INTRAVENOUS

## 2019-02-10 MED ORDER — CEFAZOLIN SODIUM-DEXTROSE 2-4 GM/100ML-% IV SOLN
2.0000 g | INTRAVENOUS | Status: AC
  Administered 2019-02-10: 13:00:00 2 g via INTRAVENOUS
  Filled 2019-02-10: qty 100

## 2019-02-10 MED ORDER — OXYCODONE HCL 5 MG PO TABS
10.0000 mg | ORAL_TABLET | ORAL | Status: DC | PRN
  Administered 2019-02-11: 10 mg via ORAL

## 2019-02-10 MED ORDER — ONDANSETRON HCL 4 MG/2ML IJ SOLN: 4.0000 mg | Freq: Once | INTRAMUSCULAR | Status: DC | PRN

## 2019-02-10 MED ORDER — MIDAZOLAM HCL 2 MG/2ML IJ SOLN
INTRAMUSCULAR | Status: AC
  Filled 2019-02-10: qty 2

## 2019-02-10 MED ORDER — POLYETHYLENE GLYCOL 3350 17 G PO PACK: 17.0000 g | PACK | Freq: Every day | ORAL | Status: DC | PRN

## 2019-02-10 MED ORDER — DOCUSATE SODIUM 100 MG PO CAPS
100.0000 mg | ORAL_CAPSULE | Freq: Two times a day (BID) | ORAL | Status: DC
  Administered 2019-02-10 – 2019-02-11 (×2): 100 mg via ORAL
  Filled 2019-02-10 (×2): qty 1

## 2019-02-10 MED ORDER — ASPIRIN 81 MG PO CHEW
81.0000 mg | CHEWABLE_TABLET | Freq: Two times a day (BID) | ORAL | Status: DC
  Administered 2019-02-10 – 2019-02-11 (×2): 81 mg via ORAL
  Filled 2019-02-10 (×2): qty 1

## 2019-02-10 MED ORDER — PROPOFOL 10 MG/ML IV BOLUS
INTRAVENOUS | Status: DC | PRN
  Administered 2019-02-10 (×3): 20 mg via INTRAVENOUS

## 2019-02-10 MED ORDER — VITAMIN D3 25 MCG PO TABS
1000.0000 [IU] | ORAL_TABLET | Freq: Every day | ORAL | Status: DC
  Administered 2019-02-11: 1000 [IU] via ORAL
  Filled 2019-02-10 (×2): qty 1

## 2019-02-10 MED ORDER — PANTOPRAZOLE SODIUM 40 MG PO TBEC
40.0000 mg | DELAYED_RELEASE_TABLET | Freq: Every day | ORAL | Status: DC
  Administered 2019-02-10 – 2019-02-11 (×2): 40 mg via ORAL
  Filled 2019-02-10 (×2): qty 1

## 2019-02-10 MED ORDER — CEFAZOLIN SODIUM-DEXTROSE 1-4 GM/50ML-% IV SOLN
1.0000 g | Freq: Four times a day (QID) | INTRAVENOUS | Status: AC
  Administered 2019-02-10 – 2019-02-11 (×2): 1 g via INTRAVENOUS
  Filled 2019-02-10 (×2): qty 50

## 2019-02-10 MED ORDER — PHENYLEPHRINE HCL-NACL 10-0.9 MG/250ML-% IV SOLN
INTRAVENOUS | Status: DC | PRN
  Administered 2019-02-10: 25 ug/min via INTRAVENOUS

## 2019-02-10 MED ORDER — CHLORHEXIDINE GLUCONATE 4 % EX LIQD: 60.0000 mL | Freq: Once | CUTANEOUS | Status: DC

## 2019-02-10 MED ORDER — DEXAMETHASONE SODIUM PHOSPHATE 10 MG/ML IJ SOLN
INTRAMUSCULAR | Status: DC | PRN
  Administered 2019-02-10: 8 mg via INTRAVENOUS

## 2019-02-10 MED ORDER — VITAMIN D3 25 MCG PO TABS: 1000.0000 [IU] | ORAL_TABLET | Freq: Every day | ORAL | Status: DC

## 2019-02-10 MED ORDER — SODIUM CHLORIDE 0.9 % IV SOLN
INTRAVENOUS | Status: DC
  Administered 2019-02-10: 17:00:00 via INTRAVENOUS

## 2019-02-10 MED ORDER — METHOCARBAMOL 500 MG PO TABS
500.0000 mg | ORAL_TABLET | Freq: Four times a day (QID) | ORAL | Status: DC | PRN
  Administered 2019-02-10: 500 mg via ORAL
  Filled 2019-02-10: qty 1

## 2019-02-10 MED ORDER — ONDANSETRON HCL 4 MG/2ML IJ SOLN
INTRAMUSCULAR | Status: AC
  Filled 2019-02-10: qty 2

## 2019-02-10 MED ORDER — METOCLOPRAMIDE HCL 5 MG PO TABS: 5.0000 mg | ORAL_TABLET | Freq: Three times a day (TID) | ORAL | Status: DC | PRN

## 2019-02-10 MED ORDER — 0.9 % SODIUM CHLORIDE (POUR BTL) OPTIME
TOPICAL | Status: DC | PRN
  Administered 2019-02-10: 1000 mL

## 2019-02-10 MED ORDER — PROPOFOL 500 MG/50ML IV EMUL
INTRAVENOUS | Status: AC
  Filled 2019-02-10: qty 50

## 2019-02-10 MED ORDER — PROPOFOL 500 MG/50ML IV EMUL
INTRAVENOUS | Status: DC | PRN
  Administered 2019-02-10: 135 ug/kg/min via INTRAVENOUS

## 2019-02-10 MED ORDER — OXYCODONE HCL 5 MG PO TABS
5.0000 mg | ORAL_TABLET | ORAL | Status: DC | PRN
  Administered 2019-02-11 (×2): 5 mg via ORAL
  Filled 2019-02-10: qty 1
  Filled 2019-02-10: qty 2
  Filled 2019-02-10: qty 1

## 2019-02-10 MED ORDER — HYDROMORPHONE HCL 1 MG/ML IJ SOLN: 0.5000 mg | INTRAMUSCULAR | Status: DC | PRN

## 2019-02-10 MED ORDER — MEPERIDINE HCL 50 MG/ML IJ SOLN: 6.2500 mg | INTRAMUSCULAR | Status: DC | PRN

## 2019-02-10 MED ORDER — ONDANSETRON HCL 4 MG PO TABS: 4.0000 mg | ORAL_TABLET | Freq: Four times a day (QID) | ORAL | Status: DC | PRN

## 2019-02-10 MED ORDER — PHENYLEPHRINE HCL (PRESSORS) 10 MG/ML IV SOLN
INTRAVENOUS | Status: AC
  Filled 2019-02-10: qty 1

## 2019-02-10 MED ORDER — EPHEDRINE 5 MG/ML INJ
INTRAVENOUS | Status: AC
  Filled 2019-02-10: qty 10

## 2019-02-10 MED ORDER — ALUM & MAG HYDROXIDE-SIMETH 200-200-20 MG/5ML PO SUSP: 30.0000 mL | ORAL | Status: DC | PRN

## 2019-02-10 MED ORDER — METHOCARBAMOL 500 MG IVPB - SIMPLE MED
500.0000 mg | Freq: Four times a day (QID) | INTRAVENOUS | Status: DC | PRN
  Filled 2019-02-10: qty 50

## 2019-02-10 MED ORDER — MIDAZOLAM HCL 2 MG/2ML IJ SOLN
INTRAMUSCULAR | Status: DC | PRN
  Administered 2019-02-10: 2 mg via INTRAVENOUS

## 2019-02-10 SURGICAL SUPPLY — 40 items
BAG ZIPLOCK 12X15 (MISCELLANEOUS) ×1 IMPLANT
BENZOIN TINCTURE PRP APPL 2/3 (GAUZE/BANDAGES/DRESSINGS) ×1 IMPLANT
BLADE SAW SGTL 18X1.27X75 (BLADE) ×2 IMPLANT
COVER PERINEAL POST (MISCELLANEOUS) ×2 IMPLANT
COVER SURGICAL LIGHT HANDLE (MISCELLANEOUS) ×2 IMPLANT
COVER WAND RF STERILE (DRAPES) ×2 IMPLANT
CUP SECTOR GRIPTON 50MM (Cup) ×1 IMPLANT
DRAPE STERI IOBAN 125X83 (DRAPES) ×2 IMPLANT
DRAPE U-SHAPE 47X51 STRL (DRAPES) ×4 IMPLANT
DRSG AQUACEL AG ADV 3.5X10 (GAUZE/BANDAGES/DRESSINGS) ×2 IMPLANT
DURAPREP 26ML APPLICATOR (WOUND CARE) ×2 IMPLANT
ELECT REM PT RETURN 15FT ADLT (MISCELLANEOUS) ×2 IMPLANT
GAUZE XEROFORM 1X8 LF (GAUZE/BANDAGES/DRESSINGS) ×2 IMPLANT
GLOVE BIO SURGEON STRL SZ7.5 (GLOVE) ×2 IMPLANT
GLOVE BIOGEL PI IND STRL 8 (GLOVE) ×2 IMPLANT
GLOVE BIOGEL PI INDICATOR 8 (GLOVE) ×2
GLOVE ECLIPSE 8.0 STRL XLNG CF (GLOVE) ×2 IMPLANT
GOWN STRL REUS W/TWL XL LVL3 (GOWN DISPOSABLE) ×4 IMPLANT
HANDPIECE INTERPULSE COAX TIP (DISPOSABLE) ×1
HEAD FEMORAL 32 CERAMIC (Hips) ×1 IMPLANT
HOLDER FOLEY CATH W/STRAP (MISCELLANEOUS) ×2 IMPLANT
KIT TURNOVER KIT A (KITS) IMPLANT
LINER ACET PNNCL PLUS4 NEUTRAL (Hips) IMPLANT
PACK ANTERIOR HIP CUSTOM (KITS) ×2 IMPLANT
PENCIL SMOKE EVACUATOR (MISCELLANEOUS) ×1 IMPLANT
PINNACLE PLUS 4 NEUTRAL (Hips) ×2 IMPLANT
SCREW 6.5MMX25MM (Screw) ×1 IMPLANT
SET HNDPC FAN SPRY TIP SCT (DISPOSABLE) ×1 IMPLANT
STAPLER VISISTAT 35W (STAPLE) IMPLANT
STEM CORAIL KA12 (Stem) ×1 IMPLANT
STRIP CLOSURE SKIN 1/2X4 (GAUZE/BANDAGES/DRESSINGS) ×1 IMPLANT
SUT ETHIBOND NAB CT1 #1 30IN (SUTURE) ×3 IMPLANT
SUT ETHILON 2 0 PS N (SUTURE) IMPLANT
SUT MNCRL AB 4-0 PS2 18 (SUTURE) ×1 IMPLANT
SUT VIC AB 0 CT1 36 (SUTURE) ×2 IMPLANT
SUT VIC AB 1 CT1 36 (SUTURE) ×2 IMPLANT
SUT VIC AB 2-0 CT1 27 (SUTURE) ×2
SUT VIC AB 2-0 CT1 TAPERPNT 27 (SUTURE) ×2 IMPLANT
TRAY FOLEY MTR SLVR 14FR STAT (SET/KITS/TRAYS/PACK) ×1 IMPLANT
YANKAUER SUCT BULB TIP 10FT TU (MISCELLANEOUS) ×2 IMPLANT

## 2019-02-10 NOTE — Evaluation (Signed)
Physical Therapy Evaluation Patient Details Name: Autumn Lindsey MRN: [id number] DOB: 1950/10/12 Today's Date: 02/10/2019   History of Present Illness  Patient is 68 y.o. female s/p Lt THA anterior approach with PMH significant for GERD, OA, depression, and Rt THA in 2014.    Clinical Impression  Jadrian Santa is a 68 y.o. female POD 0 s/p Lt THA anterior approach. Patient reports independence with mobility at baseline. Patient is now limited by functional impairments (see PT problem list below) and requires min assist for transfers and gait with RW. Patient was able to ambulate ~50 feet with RW and min assist with cues for walker management and step pattern. Patient instructed in exercise to facilitate ROM and circulation. Patient will benefit from continued skilled PT interventions to address impairments and progress towards PLOF. Acute PT will follow to progress mobility and stair training in preparation for safe discharge home.    Follow Up Recommendations Follow surgeon's recommendation for DC plan and follow-up therapies    Equipment Recommendations  None recommended by PT    Recommendations for Other Services       Precautions / Restrictions Precautions Precautions: Fall Restrictions Weight Bearing Restrictions: No      Mobility  Bed Mobility Overal bed mobility: Needs Assistance Bed Mobility: Supine to Sit     Supine to sit: HOB elevated;Supervision     General bed mobility comments: cues for use of bed rails, no assist required. pt slightly dizzy upon sitting up, BP remained normal and dizziness subsided.  Transfers Overall transfer level: Needs assistance Equipment used: Rolling walker (2 wheeled) Transfers: Sit to/from Stand Sit to Stand: Min assist         General transfer comment: cues for hand placement and technique, assist required for power up and to complete rise  Ambulation/Gait Ambulation/Gait assistance: Min assist Gait Distance (Feet):  50 Feet Assistive device: Rolling walker (2 wheeled) Gait Pattern/deviations: Step-through pattern;Decreased step length - left;Decreased step length - right;Decreased stride length;Decreased stance time - left Gait velocity: decreased   General Gait Details: cues for safe hand placement and step pattern at start. pt taking shortened steps initially and increased to a step through pattern as she continued ambulating. no overt LOB noted.  Stairs            Wheelchair Mobility    Modified Rankin (Stroke Patients Only)       Balance Overall balance assessment: Needs assistance Sitting-balance support: No upper extremity supported;Feet supported Sitting balance-Leahy Scale: Good     Standing balance support: During functional activity;Bilateral upper extremity supported Standing balance-Leahy Scale: Fair              Pertinent Vitals/Pain Pain Assessment: 0-10 Pain Score: 5  Pain Location: Lt hip Pain Descriptors / Indicators: Burning Pain Intervention(s): Limited activity within patient's tolerance;Monitored during session;Repositioned;Ice applied    Home Living Family/patient expects to be discharged to:: Private residence Living Arrangements: Spouse/significant other Available Help at Discharge: Family;Available 24 hours/day Type of Home: House Home Access: Stairs to enter Entrance Stairs-Rails: None Entrance Stairs-Number of Steps: 3 Home Layout: Two level;Bed/bath upstairs;Able to live on main level with bedroom/bathroom Home Equipment: Bedside commode;Toilet riser;Walker - 2 wheels;Cane - single point;Shower seat;Grab bars - tub/shower Additional Comments: 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 she has a bed downstairs that she can stay on and has a full bath upstairs.    Prior Function Level of Independence: Independent  Hand Dominance   Dominant Hand: Right    Extremity/Trunk Assessment   Upper Extremity Assessment Upper  Extremity Assessment: Overall WFL for tasks assessed    Lower Extremity Assessment Lower Extremity Assessment: Overall WFL for tasks assessed    Cervical / Trunk Assessment Cervical / Trunk Assessment: Normal  Communication   Communication: No difficulties  Cognition Arousal/Alertness: Awake/alert Behavior During Therapy: WFL for tasks assessed/performed Overall Cognitive Status: Within Functional Limits for tasks assessed           General Comments      Exercises Total Joint Exercises Ankle Circles/Pumps: AROM;15 reps;Seated;Both Quad Sets: AROM;10 reps;Seated;Left Heel Slides: AAROM;10 reps;Seated;Left   Assessment/Plan    PT Assessment Patient needs continued PT services  PT Problem List Decreased strength;Decreased balance;Decreased range of motion;Decreased mobility;Decreased activity tolerance;Decreased knowledge of use of DME       PT Treatment Interventions DME instruction;Functional mobility training;Balance training;Patient/family education;Modalities;Gait training;Therapeutic activities;Therapeutic exercise;Stair training    PT Goals (Current goals can be found in the Care Plan section)  Acute Rehab PT Goals Patient Stated Goal: get back to independence PT Goal Formulation: With patient Time For Goal Achievement: 02/17/19 Potential to Achieve Goals: Good    Frequency 7X/week    AM-PAC PT "6 Clicks" Mobility  Outcome Measure Help needed turning from your back to your side while in a flat bed without using bedrails?: A Little Help needed moving from lying on your back to sitting on the side of a flat bed without using bedrails?: A Little Help needed moving to and from a bed to a chair (including a wheelchair)?: A Little Help needed standing up from a chair using your arms (e.g., wheelchair or bedside chair)?: A Little Help needed to walk in hospital room?: A Little Help needed climbing 3-5 steps with a railing? : A Little 6 Click Score: 18    End of  Session Equipment Utilized During Treatment: Gait belt Activity Tolerance: Patient tolerated treatment well Patient left: in chair;with call bell/phone within reach;with family/visitor present;with chair alarm set Nurse Communication: Mobility status PT Visit Diagnosis: Muscle weakness (generalized) (M62.81);Difficulty in walking, not elsewhere classified (R26.2)    Time: [id number] PT Time Calculation (min) (ACUTE ONLY): 31 min   Charges:   PT Evaluation $PT Eval Low Complexity: 1 Low PT Treatments $Therapeutic Exercise: 8-22 mins        Kipp Brood, PT, DPT Physical Therapist with [facility]  02/10/2019 6:40 PM

## 2019-02-10 NOTE — Anesthesia Procedure Notes (Signed)
Spinal  Patient location during procedure: OR Start time: 02/10/2019 1:10 PM End time: 02/10/2019 1:13 PM Staffing Anesthesiologist: Lillia Abed, MD Performed: anesthesiologist  Preanesthetic Checklist Completed: patient identified, surgical consent, pre-op evaluation, timeout performed, IV checked, risks and benefits discussed and monitors and equipment checked Spinal Block Patient position: sitting Prep: DuraPrep Patient monitoring: heart rate, cardiac monitor, continuous pulse ox and blood pressure Approach: right paramedian Location: L3-4 Injection technique: single-shot Needle Needle type: Pencan  Needle gauge: 24 G Needle length: 9 cm Needle insertion depth: 7 cm

## 2019-02-10 NOTE — Brief Op Note (Signed)
02/10/2019  2:25 PM  PATIENT:  Autumn Lindsey  68 y.o. female  PRE-OPERATIVE DIAGNOSIS:  osteoarthritis left hip  POST-OPERATIVE DIAGNOSIS:  osteoarthritis left hip  PROCEDURE:  Procedure(s): LEFT TOTAL HIP ARTHROPLASTY ANTERIOR APPROACH (Left)  SURGEON:  Surgeon(s) and Role:    Mcarthur Rossetti, MD - Primary  PHYSICIAN ASSISTANT: Benita Stabile, PA-C  ANESTHESIA:   spinal  EBL:  200 mL   COUNTS:  YES  DICTATION: .Other Dictation: Dictation Number [id number]  PLAN OF CARE: Admit to inpatient   PATIENT DISPOSITION:  PACU - hemodynamically stable.   Delay start of Pharmacological VTE agent (>24hrs) due to surgical blood loss or risk of bleeding: no

## 2019-02-10 NOTE — Anesthesia Preprocedure Evaluation (Signed)
Anesthesia Evaluation  Patient identified by MRN, date of birth, ID band Patient awake    Reviewed: Allergy & Precautions, NPO status , Patient's Chart, lab work & pertinent test results  History of Anesthesia Complications (+) PONV  Airway Mallampati: I  TM Distance: >3 FB Neck ROM: Full    Dental   Pulmonary    Pulmonary exam normal        Cardiovascular Normal cardiovascular exam     Neuro/Psych Depression    GI/Hepatic   Endo/Other    Renal/GU      Musculoskeletal   Abdominal   Peds  Hematology   Anesthesia Other Findings   Reproductive/Obstetrics                             Anesthesia Physical Anesthesia Plan  ASA: II  Anesthesia Plan: Spinal   Post-op Pain Management:    Induction: Intravenous  PONV Risk Score and Plan: 3  Airway Management Planned: Nasal Cannula  Additional Equipment:   Intra-op Plan:   Post-operative Plan:   Informed Consent: I have reviewed the patients History and Physical, chart, labs and discussed the procedure including the risks, benefits and alternatives for the proposed anesthesia with the patient or authorized representative who has indicated his/her understanding and acceptance.       Plan Discussed with: CRNA and Surgeon  Anesthesia Plan Comments:         Anesthesia Quick Evaluation

## 2019-02-10 NOTE — Transfer of Care (Signed)
Immediate Anesthesia Transfer of Care Note  Patient: Loren Racer  Procedure(s) Performed: LEFT TOTAL HIP ARTHROPLASTY ANTERIOR APPROACH (Left Hip)  Patient Location: PACU  Anesthesia Type:Spinal  Level of Consciousness: awake  Airway & Oxygen Therapy: Patient Spontanous Breathing and Patient connected to face mask oxygen  Post-op Assessment: Report given to RN and Post -op Vital signs reviewed and stable  Post vital signs: Reviewed and stable  Last Vitals:  Vitals Value Taken Time  BP    Temp    Pulse 47 02/10/19 1441  Resp 12 02/10/19 1441  SpO2 100 % 02/10/19 1441  Vitals shown include unvalidated device data.  Last Pain:  Vitals:   02/10/19 1002  TempSrc:   PainSc: 5       Patients Stated Pain Goal: 4 ([id number])  Complications: No apparent anesthesia complications

## 2019-02-10 NOTE — Op Note (Signed)
NAME: Autumn Lindsey, Autumn Lindsey MEDICAL RECORD [id number] ACCOUNT [id number] DATE OF BIRTH:February 18, 1951 FACILITY: [facility] LOCATION: [facility] PHYSICIAN:Rayona Sardinha Kerry Fort, MD  OPERATIVE REPORT  DATE OF PROCEDURE:  02/10/2019  PREOPERATIVE DIAGNOSIS:  Primary arthritis and degenerative joint disease, left hip.  POSTOPERATIVE DIAGNOSIS:  Primary arthritis and degenerative joint disease, left hip.  PROCEDURE:  Left total hip arthroplasty through direct anterior approach.  IMPLANTS:  DePuy Sector Gription acetabular component size 50, size 32+4 neutral polyethylene liner.  A single screw in the acetabulum.  Size 12 Corail femoral component with standard offset, size 32+1 ceramic hip ball.  SURGEON:  Lind Guest. Ninfa Linden, MD  ASSISTANT:  Erskine Emery, PA-C.  ANESTHESIA:  Spinal.  ANTIBIOTICS:  Two grams IV Ancef.  ESTIMATED BLOOD LOSS:  200 mL.  COMPLICATIONS:  None.  INDICATIONS:  The patient is a 68 year old female well known to me.  She has debilitating arthritis involving her left hip.  She actually had a right total hip arthroplasty that we did in 2014 through direct anterior approach.  Her left hip pain has  worsened over the last 2 years.  It has begun to detrimentally affect her mobility her quality of life and activities of daily living to the point she does wish to proceed with total hip arthroplasty on the left side.  Having had this before, she is  fully aware of the risk of acute blood loss anemia, nerve and vessel injury, fracture, infection, dislocation, DVT and implant failure.  She understands our goals are to decrease pain, improve mobility and overall improve quality of life.  DESCRIPTION OF PROCEDURE:  After informed consent was obtained and appropriate left hip was marked, she was brought to the operating room and sat up on a stretcher.  Spinal anesthesia was then obtained.  We had her laid in supine position on a stretcher,  then Foley catheter was placed.  I was  able to assess her leg length and she definitely was equal with leg lengths.  Traction boots were placed on both her feet.  I placed her supine on the Hana fracture table, the perineal post in place and both legs  in line skeletal traction device and no traction applied.  Preoperatively, I did get some x-rays under fluoroscopy so we could really assess what her true leg lengths are as well with knowing that her leg lengths are equal.  We wanted to make sure that  we match a picture that was taken with the C-arm.  Her left hip was then prepped and draped with DuraPrep and sterile drapes.  A time-out was called and she was identified, correct patient, correct left hip.  We then made an incision just inferior and  posterior to the anterior superior iliac spine and carried this obliquely down the leg.  We dissected down tensor fascia lata muscle.  Tensor fascia was then divided longitudinally to proceed with direct anterior approach to the hip.  We identified and  cauterized circumflex vessels.  I then identified the hip capsule, opened the hip capsule in an L-type format, finding moderate joint effusion and significant periarticular osteophytes around the femoral head and neck.  We placed curved retractors around  the medial and lateral femoral neck and then made our femoral neck cut just proximal to the lesser trochanter with an oscillating saw and completed this with an osteotome.  We placed a corkscrew guide in the femoral head and removed the femoral head in  its entirety and found a wide area devoid of cartilage.  I then placed a bent Hohmann over the medial acetabular rim and removed remnants of the acetabular labrum and other debris from the hip.  We then began reaming under direct visualization from a  size 44 reamer in stepwise increments up to a size 49 with all reamers under direct visualization, the last reamer under direct fluoroscopy, so we could obtain our depth in reaming our inclination and  anteversion.  I then placed the real DePuy Sector  Gription acetabular component size 50 and a single screw.  We went with a 32+4 neutral polyethylene liner for that size 50 acetabular component.  Attention was then turned to the femur.  With the leg externally rotated to 120 degrees, extended and  adducted, we were placing Mueller retractor medially and Hohman retractor behind the greater trochanter, released lateral joint capsule and used a box-cutting osteotome to enter the femoral canal and a rongeur to lateralize then began broaching using the  Corail broaching system from a size 8 to a size 12.  With the size 12 in place, we trialed a standard offset femoral neck and a 32+1 trial hip ball.  We brought the leg back over and up and with traction and internal rotation reducing the pelvis we were  pleased with our rotation and stability assessed mechanically as well as leg length and offset assessed radiographically.  We then dislocated the hip and removed the trial components.  We placed the real Corail femoral component with standard offset  size 12 and the real 32+1 ceramic hip ball and again reduced this in the acetabulum and it was stable.  We then irrigated the soft tissue with normal saline solution using pulsatile lavage.  We closed the joint capsule with interrupted #1 Ethibond  suture.  Again, assessed her radiographically before closing to make sure that everything looked good based on what we had trialed.  We then closed the joint capsule with #1 Ethibond suture, followed by closing the tensor fascia with #1 Vicryl, 0 Vicryl  was used to close deep tissue, 2-0 Vicryl was used to close subcutaneous tissue and 4-0 Monocryl suture to reapproximate the subcutaneous tissue.  Steri-Strips were applied and Aquacel dressing was placed.  She was then taken off of the Hana table and  taken to recovery room in stable condition.  All final counts were correct.  There were no complications noted.  Of note,  Benita Stabile, PA-C, assisted in the entire case.  His assistance was crucial for facilitating all aspects of this case.  TN/NUANCE  D:02/10/2019 T:02/10/2019 JOB:[id number]

## 2019-02-10 NOTE — Anesthesia Postprocedure Evaluation (Signed)
Anesthesia Post Note  Patient: Autumn Lindsey  Procedure(s) Performed: LEFT TOTAL HIP ARTHROPLASTY ANTERIOR APPROACH (Left Hip)     Anesthesia Post Evaluation  Last Vitals:  Vitals:   02/10/19 1445 02/10/19 1500  BP: 118/65 101/73  Pulse: (!) 109 67  Resp: 14 11  Temp:    SpO2: 100% 97%    Last Pain:  Vitals:   02/10/19 1500  TempSrc:   PainSc: 0-No pain                 Pervis Hocking

## 2019-02-10 NOTE — Anesthesia Procedure Notes (Signed)
Procedure Name: MAC Date/Time: 02/10/2019 1:02 PM Performed by: Niel Hummer, CRNA Pre-anesthesia Checklist: Patient identified, Emergency Drugs available, Suction available and Patient being monitored Patient Re-evaluated:Patient Re-evaluated prior to induction Oxygen Delivery Method: Simple face mask

## 2019-02-10 NOTE — H&P (Signed)
The patient understands fully that we are proceeding to surgery today for left total hip arthroplasty.  All questions and concerns were answered and addressed.  She has had no acute change in her medical status.  Please refer to the history and physical exam.

## 2019-02-11 LAB — BASIC METABOLIC PANEL
Anion gap: 9 (ref 5–15)
BUN: 20 mg/dL (ref 8–23)
CO2: 23 mmol/L (ref 22–32)
Calcium: 7.9 mg/dL — ABNORMAL LOW (ref 8.9–10.3)
Chloride: 102 mmol/L (ref 98–111)
Creatinine, Ser: 0.71 mg/dL (ref 0.44–1.00)
GFR calc Af Amer: >60 mL/min (ref >60)
GFR calc non Af Amer: >60 mL/min (ref >60)
Glucose, Bld: 166 mg/dL — ABNORMAL HIGH (ref 70–99)
Potassium: 4 mmol/L (ref 3.5–5.1)
Sodium: 134 mmol/L — ABNORMAL LOW (ref 135–145)

## 2019-02-11 LAB — CBC
HCT: 31.5 % — ABNORMAL LOW (ref 36.0–46.0)
Hemoglobin: 10.1 g/dL — ABNORMAL LOW (ref 12.0–15.0)
MCH: 31.4 pg (ref 26.0–34.0)
MCHC: 32.1 g/dL (ref 30.0–36.0)
MCV: 97.8 fL (ref 80.0–100.0)
Platelets: 214 10*3/uL (ref 150–400)
RBC: 3.22 MIL/uL — ABNORMAL LOW (ref 3.87–5.11)
RDW: 12.1 % (ref 11.5–15.5)
WBC: 9.3 10*3/uL (ref 4.0–10.5)
nRBC: 0 % (ref 0.0–0.2)

## 2019-02-11 MED ORDER — OXYCODONE HCL 5 MG PO TABS: 5.0000 mg | ORAL_TABLET | ORAL | 0 refills | Status: DC | PRN

## 2019-02-11 MED ORDER — METHOCARBAMOL 500 MG PO TABS: 500.0000 mg | ORAL_TABLET | Freq: Four times a day (QID) | ORAL | 1 refills | Status: DC | PRN

## 2019-02-11 MED ORDER — ASPIRIN 81 MG PO CHEW: 81.0000 mg | CHEWABLE_TABLET | Freq: Two times a day (BID) | ORAL | 0 refills | Status: DC

## 2019-02-11 NOTE — TOC Progression Note (Addendum)
Transition of Care [facility]) - Progression Note    Patient Details  Name: Christena Kershaw MRN: [id number] Date of Birth: Mar 21, 1950  Transition [facility]) CM/SW Contact  Joaquin Courts, RN Phone Number: 02/11/2019, 1:51 PM  Clinical Narrative:    CM spoke with patient at bedside, patient set up with [facility] home care for HHPT, earliest start of care date is on Tuesday, MD was notified of this and he is agreeable. Reports has rolling walker and 3-in-1 at home.   Expected Discharge Plan: [place] Barriers to Discharge: No Barriers Identified  Expected Discharge Plan and Services Expected Discharge Plan: [place]   Discharge Planning Services: CM Consult Post Acute Care Choice: [place] arrangements for the past 2 months: Single Family Home Expected Discharge Date: 02/11/19               DME Arranged: N/A DME Agency: NA       HH Arranged: PT HH Agency: [place] Date HH Agency Contacted: 02/11/19 Time [place]: 1350 Representative spoke with at [place]: [place] ([place]) Interventions    Readmission Risk Interventions No flowsheet data found.

## 2019-02-11 NOTE — Progress Notes (Signed)
Physical Therapy Treatment Patient Details Name: Autumn Lindsey MRN: [id number] DOB: January 16, 1951 Today's Date: 02/11/2019    History of Present Illness Patient is 68 y.o. female s/p Lt THA anterior approach with PMH significant for GERD, OA, depression, and Rt THA in 2014.    PT Comments    POD # 1 am session Pt OOB in recliner.  Assisted with amb a greater distance then practiced stairs.  General Gait Details: 25% VC's on proper walker to self distance and upright posture.  Then returned to room to perform some TE's following HEP handout.  Instructed on proper tech, freq as well as use of ICE.   Pt will need another PT session with care giver prior to D/C.   Follow Up Recommendations  Follow surgeon's recommendation for DC plan and follow-up therapies     Equipment Recommendations  None recommended by PT    Recommendations for Other Services       Precautions / Restrictions Precautions Precautions: Fall Restrictions Weight Bearing Restrictions: No Other Position/Activity Restrictions: WBAT    Mobility  Bed Mobility               General bed mobility comments: OOB in recliner  Transfers Overall transfer level: Needs assistance Equipment used: Rolling walker (2 wheeled) Transfers: Sit to/from Stand Sit to Stand: Supervision;Min guard         General transfer comment: cues for hand placement and technique, assist required for power up and to complete rise  Ambulation/Gait Ambulation/Gait assistance: Supervision;Min guard Gait Distance (Feet): 186 Feet Assistive device: Rolling walker (2 wheeled) Gait Pattern/deviations: Step-through pattern;Decreased step length - left;Decreased step length - right;Decreased stride length;Decreased stance time - left Gait velocity: decreased   General Gait Details: 25% VC's on proper walker to self distance and upright posture   Stairs Stairs: Yes Stairs assistance: Supervision;Min guard Stair Management: One rail  Left;Step to pattern;Forwards Number of Stairs: 8 General stair comments: 25% VC's on proper sequencing and safety   Wheelchair Mobility    Modified Rankin (Stroke Patients Only)       Balance                                            Cognition Arousal/Alertness: Awake/alert Behavior During Therapy: WFL for tasks assessed/performed Overall Cognitive Status: Within Functional Limits for tasks assessed                                        Exercises   Total Hip Replacement TE's 10 reps ankle pumps 10 reps knee presses 10 reps heel slides 10 reps SAQ's 10 reps ABD Followed by ICE     General Comments        Pertinent Vitals/Pain Pain Assessment: 0-10 Pain Score: 3  Pain Location: Lt hip Pain Descriptors / Indicators: Discomfort;Sore Pain Intervention(s): Monitored during session;Premedicated before session;Repositioned;Ice applied    Home Living                      Prior Function            PT Goals (current goals can now be found in the care plan section) Progress towards PT goals: Progressing toward goals    Frequency    7X/week      PT  Plan Current plan remains appropriate    Co-evaluation              AM-PAC PT "6 Clicks" Mobility   Outcome Measure  Help needed turning from your back to your side while in a flat bed without using bedrails?: A Little Help needed moving from lying on your back to sitting on the side of a flat bed without using bedrails?: A Little Help needed moving to and from a bed to a chair (including a wheelchair)?: A Little Help needed standing up from a chair using your arms (e.g., wheelchair or bedside chair)?: A Little Help needed to walk in hospital room?: A Little Help needed climbing 3-5 steps with a railing? : A Little 6 Click Score: 18    End of Session Equipment Utilized During Treatment: Gait belt Activity Tolerance: Patient tolerated treatment well Patient  left: in chair;with call bell/phone within reach;with family/visitor present;with chair alarm set Nurse Communication: Mobility status PT Visit Diagnosis: Muscle weakness (generalized) (M62.81);Difficulty in walking, not elsewhere classified (R26.2)     Time: [id number] PT Time Calculation (min) (ACUTE ONLY): 43 min  Charges:  $Gait Training: 8-22 mins $Therapeutic Exercise: 8-22 mins $Therapeutic Activity: 8-22 mins                     Rica Koyanagi  PTA Acute  Rehabilitation Services Pager      [phone] Office      [phone]

## 2019-02-11 NOTE — Progress Notes (Signed)
Physical Therapy Treatment Patient Details Name: Autumn Lindsey MRN: [id number] DOB: 1950/05/22 Today's Date: 02/11/2019    History of Present Illness Patient is 68 y.o. female s/p Lt THA anterior approach with PMH significant for GERD, OA, depression, and Rt THA in 2014.    PT Comments    POD # 1 pm session with Significant other present for care education Assisted with amb a great distance in hallway.  Practiced stairs (front porch no rails and flight inside) "hand on" instruction on proper tech and safe handling.  Then returned to room to perform some TE's following HEP handout.  Instructed on proper tech, freq as well as use of ICE.   Addressed all mobility questions, discussed appropriate activity, educated on use of ICE.  Pt ready for D/C to home.   Follow Up Recommendations  Follow surgeon's recommendation for DC plan and follow-up therapies     Equipment Recommendations  None recommended by PT    Recommendations for Other Services       Precautions / Restrictions Precautions Precautions: Fall Restrictions Weight Bearing Restrictions: No Other Position/Activity Restrictions: WBAT    Mobility  Bed Mobility               General bed mobility comments: OOB in recliner  Transfers Overall transfer level: Needs assistance Equipment used: Rolling walker (2 wheeled) Transfers: Sit to/from Stand Sit to Stand: Supervision;Min guard         General transfer comment: cues for hand placement and technique, assist required for power up and to complete rise  Ambulation/Gait Ambulation/Gait assistance: Supervision;Min guard Gait Distance (Feet): 186 Feet Assistive device: Rolling walker (2 wheeled) Gait Pattern/deviations: Step-through pattern;Decreased step length - left;Decreased step length - right;Decreased stride length;Decreased stance time - left Gait velocity: decreased   General Gait Details: 25% VC's on proper walker to self distance and upright  posture   Stairs Stairs: Yes Stairs assistance: Supervision;Min guard Stair Management: One rail Left;Step to pattern;Forwards Number of Stairs: 14  General stair comments: 25% VC's on proper sequencing and safety with significant other present "hand on"    Wheelchair Mobility    Modified Rankin (Stroke Patients Only)       Balance                                            Cognition Arousal/Alertness: Awake/alert Behavior During Therapy: WFL for tasks assessed/performed Overall Cognitive Status: Within Functional Limits for tasks assessed                                        Exercises  10 reps all stand TE's following HEP    General Comments        Pertinent Vitals/Pain Pain Assessment: 0-10 Pain Score: 3  Pain Location: Lt hip Pain Descriptors / Indicators: Discomfort;Sore Pain Intervention(s): Monitored during session;Premedicated before session;Repositioned;Ice applied    Home Living                      Prior Function            PT Goals (current goals can now be found in the care plan section) Progress towards PT goals: Progressing toward goals    Frequency    7X/week      PT  Plan Current plan remains appropriate    Co-evaluation              AM-PAC PT "6 Clicks" Mobility   Outcome Measure  Help needed turning from your back to your side while in a flat bed without using bedrails?: A Little Help needed moving from lying on your back to sitting on the side of a flat bed without using bedrails?: A Little Help needed moving to and from a bed to a chair (including a wheelchair)?: A Little Help needed standing up from a chair using your arms (e.g., wheelchair or bedside chair)?: A Little Help needed to walk in hospital room?: A Little Help needed climbing 3-5 steps with a railing? : A Little 6 Click Score: 18    End of Session Equipment Utilized During Treatment: Gait belt Activity  Tolerance: Patient tolerated treatment well Patient left: in chair;with call bell/phone within reach;with family/visitor present;with chair alarm set Nurse Communication: Mobility status PT Visit Diagnosis: Muscle weakness (generalized) (M62.81);Difficulty in walking, not elsewhere classified (R26.2)     Time: 1305-1330 PT Time Calculation (min) (ACUTE ONLY): 25 min  Charges:  $Gait Training: 8-22 mins $Therapeutic Exercise: 8-22 mins                      Rica Koyanagi  PTA Acute  Rehabilitation Services Pager      [phone] Office      [phone]

## 2019-02-11 NOTE — Discharge Instructions (Signed)

## 2019-02-11 NOTE — Progress Notes (Signed)
   Subjective: 1 Day Post-Op Procedure(s) (LRB): LEFT TOTAL HIP ARTHROPLASTY ANTERIOR APPROACH (Left) Patient reports pain as moderate.  Pain bad last night , much better today. Ready to go home.   Objective: Vital signs in last 24 hours: Temp:  [97.5 F (36.4 C)-99.3 F (37.4 C)] 99.3 F (37.4 C) (12/05 0817) Pulse Rate:  [47-109] 85 (12/05 0817) Resp:  [11-17] 16 (12/05 0817) BP: (101-124)/(55-73) 105/65 (12/05 0817) SpO2:  [92 %-100 %] 98 % (12/05 0817)  Intake/Output from previous day: 12/04 0701 - 12/05 0700 In: 2777.5 [P.O.:840; I.V.:1737.5; IV Piggyback:200] Out: 2500 [Urine:2300; Blood:200] Intake/Output this shift: Total I/O In: 240 [P.O.:240] Out: 200 [Urine:200]  Recent Labs    02/08/19 1454 02/11/19 0225  HGB 12.4 10.1*   Recent Labs    02/08/19 1454 02/11/19 0225  WBC 6.3 9.3  RBC 3.98 3.22*  HCT 38.8 31.5*  PLT 250 214   Recent Labs    02/08/19 1454 02/11/19 0225  NA 139 134*  K 4.2 4.0  CL 105 102  CO2 26 23  BUN 25* 20  CREATININE 0.91 0.71  GLUCOSE 99 166*  CALCIUM 9.0 7.9*   No results for input(s): LABPT, INR in the last 72 hours.  Neurologically intact Dg Pelvis Portable  Result Date: 02/10/2019 CLINICAL DATA:  Postop left total hip arthroplasty. EXAM: PORTABLE PELVIS 1-2 VIEWS COMPARISON:  Intraoperative views earlier the same date. Pelvic radiographs 07/29/2012. FINDINGS: AP view of the lower pelvis demonstrates interval left total hip arthroplasty with a screw fixed acetabular component. The hardware is well positioned. There is no evidence of acute fracture or dislocation. Previous right total hip arthroplasty appears unchanged. There are mild sacroiliac degenerative changes bilaterally. There is mild gas within the soft tissues surrounding the left hip. IMPRESSION: Interval left total hip arthroplasty without demonstrated complication. Electronically Signed   By: Richardean Sale M.D.   On: 02/10/2019 15:10   Dg C-arm 1-60 Min-no  Report  Result Date: 02/10/2019 Fluoroscopy was utilized by the requesting physician.  No radiographic interpretation.   Dg Hip Operative Unilat W Or W/o Pelvis Left  Result Date: 02/10/2019 CLINICAL DATA:  Left hip replacement EXAM: OPERATIVE left HIP (WITH PELVIS IF PERFORMED) 2 VIEWS TECHNIQUE: Fluoroscopic spot image(s) were submitted for interpretation post-operatively. COMPARISON:  05/16/2018 FINDINGS: Two low resolution intraoperative spot views of the left hip. Total fluoroscopy time was 13 seconds. Images demonstrate a left hip replacement with normal alignment. IMPRESSION: Intraoperative fluoroscopic assistance provided during left hip replacement surgery. Electronically Signed   By: Donavan Foil M.D.   On: 02/10/2019 14:23    Assessment/Plan: 1 Day Post-Op Procedure(s) (LRB): LEFT TOTAL HIP ARTHROPLASTY ANTERIOR APPROACH (Left) Plan:  Discharge home. Did stairs.   Autumn Lindsey 02/11/2019, 11:20 AM

## 2019-02-13 ENCOUNTER — Encounter (HOSPITAL_COMMUNITY): Payer: Self-pay | Admitting: Orthopaedic Surgery

## 2019-02-13 NOTE — Discharge Summary (Signed)
Patient ID: Autumn Lindsey MRN: [id number] DOB/AGE: [birth year] 68 y.o.  Admit date: 02/10/2019 Discharge date: 02/13/2019  Admission Diagnoses:  Principal Problem:   Unilateral primary osteoarthritis, left hip Active Problems:   Status post total replacement of left hip   H/O total hip arthroplasty   Discharge Diagnoses:  Same  Past Medical History:  Diagnosis Date  . Anemia   . Arthritis   . Depression   . GERD (gastroesophageal reflux disease)   . Headache(784.0)    in past  . History of melanoma excision   . PONV (postoperative nausea and vomiting)   . Unilateral primary osteoarthritis, left hip 02/09/2019    Surgeries: Procedure(s): LEFT TOTAL HIP ARTHROPLASTY ANTERIOR APPROACH on 02/10/2019   Consultants:   Discharged Condition: Improved  Hospital Course: Autumn Lindsey is an 68 y.o. female who was admitted 02/10/2019 for operative treatment ofUnilateral primary osteoarthritis, left hip. Patient has severe unremitting pain that affects sleep, daily activities, and work/hobbies. After pre-op clearance the patient was taken to the operating room on 02/10/2019 and underwent  Procedure(s): LEFT TOTAL HIP ARTHROPLASTY ANTERIOR APPROACH.    Patient was given perioperative antibiotics:  Anti-infectives (From admission, onward)   Start     Dose/Rate Route Frequency Ordered Stop   02/10/19 1930  ceFAZolin (ANCEF) IVPB 1 g/50 mL premix     1 g 100 mL/hr over 30 Minutes Intravenous Every 6 hours 02/10/19 1605 02/11/19 0219   02/10/19 1000  ceFAZolin (ANCEF) IVPB 2g/100 mL premix     2 g 200 mL/hr over 30 Minutes Intravenous On call to O.R. 02/10/19 0945 02/10/19 1343       Patient was given sequential compression devices, early ambulation, and chemoprophylaxis to prevent DVT.  Patient benefited maximally from hospital stay and there were no complications.    Recent vital signs: No data found.   Recent laboratory studies:  Recent Labs    02/11/19 0225  WBC 9.3   HGB 10.1*  HCT 31.5*  PLT 214  NA 134*  K 4.0  CL 102  CO2 23  BUN 20  CREATININE 0.71  GLUCOSE 166*  CALCIUM 7.9*     Discharge Medications:   Allergies as of 02/11/2019   No Known Allergies     Medication List    TAKE these medications   acetaminophen 325 MG tablet Commonly known as: TYLENOL Take 650 mg by mouth every 6 (six) hours as needed (pain).   aspirin 81 MG chewable tablet Chew 1 tablet (81 mg total) by mouth 2 (two) times daily.   atorvastatin 20 MG tablet Commonly known as: LIPITOR Take 20 mg by mouth at bedtime.   CALCIUM 600 + D PO Take 1 tablet by mouth daily.   cholecalciferol 25 MCG (1000 UT) tablet Commonly known as: VITAMIN D3 Take 1,000 Units by mouth daily.   estradiol-norethindrone 1-0.5 MG tablet Commonly known as: ACTIVELLA Take 1 tablet by mouth daily.   fish oil-omega-3 fatty acids 1000 MG capsule Take 1 g by mouth daily.   Flintstones Complete 18 MG Chew Chew 1 tablet by mouth daily.   ibuprofen 200 MG tablet Commonly known as: ADVIL Take 600 mg by mouth every 8 (eight) hours as needed (pain).   methocarbamol 500 MG tablet Commonly known as: ROBAXIN Take 1 tablet (500 mg total) by mouth every 6 (six) hours as needed for muscle spasms.   minoxidil 2 % external solution Commonly known as: ROGAINE Apply 1 application topically daily.   oxyCODONE 5 MG  immediate release tablet Commonly known as: Oxy IR/ROXICODONE Take 1-2 tablets (5-10 mg total) by mouth every 4 (four) hours as needed for moderate pain (pain score 4-6).   PreserVision AREDS 2 Caps Take 1 capsule by mouth daily.   SUMAtriptan 50 MG tablet Commonly known as: IMITREX Take 50 mg by mouth every 2 (two) hours as needed for migraine.   vitamin E 400 UNIT capsule Take 400 Units by mouth daily.       Diagnostic Studies: Dg Pelvis Portable  Result Date: 02/10/2019 CLINICAL DATA:  Postop left total hip arthroplasty. EXAM: PORTABLE PELVIS 1-2 VIEWS  COMPARISON:  Intraoperative views earlier the same date. Pelvic radiographs 07/29/2012. FINDINGS: AP view of the lower pelvis demonstrates interval left total hip arthroplasty with a screw fixed acetabular component. The hardware is well positioned. There is no evidence of acute fracture or dislocation. Previous right total hip arthroplasty appears unchanged. There are mild sacroiliac degenerative changes bilaterally. There is mild gas within the soft tissues surrounding the left hip. IMPRESSION: Interval left total hip arthroplasty without demonstrated complication. Electronically Signed   By: Richardean Sale M.D.   On: 02/10/2019 15:10   Dg C-arm 1-60 Min-no Report  Result Date: 02/10/2019 Fluoroscopy was utilized by the requesting physician.  No radiographic interpretation.   Dg Hip Operative Unilat W Or W/o Pelvis Left  Result Date: 02/10/2019 CLINICAL DATA:  Left hip replacement EXAM: OPERATIVE left HIP (WITH PELVIS IF PERFORMED) 2 VIEWS TECHNIQUE: Fluoroscopic spot image(s) were submitted for interpretation post-operatively. COMPARISON:  05/16/2018 FINDINGS: Two low resolution intraoperative spot views of the left hip. Total fluoroscopy time was 13 seconds. Images demonstrate a left hip replacement with normal alignment. IMPRESSION: Intraoperative fluoroscopic assistance provided during left hip replacement surgery. Electronically Signed   By: Donavan Foil M.D.   On: 02/10/2019 14:23    Disposition:     Follow-up Information    Mcarthur Rossetti, MD Follow up in 2 week(s).   Specialty: Orthopedic Surgery Contact information: [place] [address] [phone]        [facility] Follow up.   Why: agency will provide home health physical therapy, agency will call you to schedule first visit. Contact information: [place] [address] [phone]            Signed: Erskine Emery 02/13/2019, 1:49 PM

## 2019-02-16 ENCOUNTER — Telehealth: Payer: Self-pay | Admitting: Orthopaedic Surgery

## 2019-02-16 MED ORDER — OXYCODONE HCL 5 MG PO TABS: 5.0000 mg | ORAL_TABLET | ORAL | 0 refills | Status: DC | PRN

## 2019-02-16 NOTE — Telephone Encounter (Signed)
Patient called in and requesting refill on Oxycodone from Dr. Ninfa Linden. Patient states pharmacy is [facility] [address]. Patient phone number is [phone].

## 2019-02-16 NOTE — Telephone Encounter (Signed)
Please advise 

## 2019-02-27 ENCOUNTER — Ambulatory Visit (INDEPENDENT_AMBULATORY_CARE_PROVIDER_SITE_OTHER): Payer: BC Managed Care – PPO | Admitting: Orthopaedic Surgery

## 2019-02-27 ENCOUNTER — Other Ambulatory Visit: Payer: Self-pay

## 2019-02-27 ENCOUNTER — Encounter: Payer: Self-pay | Admitting: Orthopaedic Surgery

## 2019-02-27 VITALS — Ht 67.5 in | Wt 154.0 lb

## 2019-02-27 DIAGNOSIS — Z96642 Presence of left artificial hip joint: Secondary | ICD-10-CM

## 2019-02-27 MED ORDER — OXYCODONE HCL 5 MG PO TABS: 5.0000 mg | ORAL_TABLET | Freq: Four times a day (QID) | ORAL | 0 refills | Status: DC | PRN

## 2019-02-27 NOTE — Progress Notes (Signed)
The patient is 2 weeks status post a left total hip arthroplasty.  She is walking with a cane.  Says she is doing well overall.  She is requesting a refill of oxycodone even though she is taking it sparingly.  I agree with this.  On examination her left hip incision looks good.  Steri-Strips are in place in place new Steri-Strips.  Her leg lengths are equal.  There is no significant swelling or seroma.  She will get an aspirin once a day for a week and then can stop her aspirin.  She will continue working on mobility.  All question concerns were answered and addressed.  We will hold off on the outpatient therapy unless she feels like she needs it after we see her in 4 weeks.  At her next visit no x-rays are needed.  If there is any issues before then she will let us know.

## 2019-03-13 ENCOUNTER — Telehealth: Payer: Self-pay | Admitting: Orthopaedic Surgery

## 2019-03-13 ENCOUNTER — Other Ambulatory Visit: Payer: Self-pay

## 2019-03-13 DIAGNOSIS — Z96642 Presence of left artificial hip joint: Secondary | ICD-10-CM

## 2019-03-13 NOTE — Telephone Encounter (Signed)
Patient called. She would like to start out patient physical therapy. Would like a referral. Her call back number is [phone]

## 2019-03-13 NOTE — Telephone Encounter (Signed)
Want to just send her here for a few visits?

## 2019-03-13 NOTE — Telephone Encounter (Signed)
I am fine with her having a few outpatient PT visits post her total hip replacement.

## 2019-03-13 NOTE — Telephone Encounter (Signed)
Order sent for PT

## 2019-03-15 ENCOUNTER — Encounter: Payer: Self-pay | Admitting: Physical Therapy

## 2019-03-15 ENCOUNTER — Ambulatory Visit (INDEPENDENT_AMBULATORY_CARE_PROVIDER_SITE_OTHER): Payer: BC Managed Care – PPO | Admitting: Physical Therapy

## 2019-03-15 ENCOUNTER — Other Ambulatory Visit: Payer: Self-pay

## 2019-03-15 DIAGNOSIS — M6281 Muscle weakness (generalized): Secondary | ICD-10-CM | POA: Diagnosis not present

## 2019-03-15 DIAGNOSIS — M25552 Pain in left hip: Secondary | ICD-10-CM

## 2019-03-15 DIAGNOSIS — R2689 Other abnormalities of gait and mobility: Secondary | ICD-10-CM

## 2019-03-16 ENCOUNTER — Encounter: Payer: Self-pay | Admitting: Physical Therapy

## 2019-03-16 ENCOUNTER — Ambulatory Visit (INDEPENDENT_AMBULATORY_CARE_PROVIDER_SITE_OTHER): Payer: BC Managed Care – PPO | Admitting: Physical Therapy

## 2019-03-16 DIAGNOSIS — M25552 Pain in left hip: Secondary | ICD-10-CM | POA: Diagnosis not present

## 2019-03-16 DIAGNOSIS — M6281 Muscle weakness (generalized): Secondary | ICD-10-CM

## 2019-03-16 DIAGNOSIS — R2689 Other abnormalities of gait and mobility: Secondary | ICD-10-CM

## 2019-03-16 NOTE — Therapy (Signed)
[facility] [address] [place], [place], [address] Phone: [phone]   Fax:  [phone]  Physical Therapy Treatment  Patient Details  Name: Autumn Lindsey MRN: [id number] Date of Birth: 08/28/1950 Referring Provider (PT): Jean Rosenthal   Encounter Date: 03/16/2019  PT End of Session - 03/16/19 0944    Visit Number  2    Number of Visits  12    Date for PT Re-Evaluation  04/26/19    Authorization Type  [employer]    PT Start Time  0933    PT Stop Time  1015    PT Time Calculation (min)  42 min    Activity Tolerance  Patient tolerated treatment well    Behavior During Therapy  [facility] for tasks assessed/performed       Past Medical History:  Diagnosis Date  . Anemia   . Arthritis   . Depression   . GERD (gastroesophageal reflux disease)   . Headache(784.0)    in past  . History of melanoma excision   . PONV (postoperative nausea and vomiting)   . Unilateral primary osteoarthritis, left hip 02/09/2019    Past Surgical History:  Procedure Laterality Date  . APPENDECTOMY  1970's  . BREAST ENHANCEMENT SURGERY    . SKIN GRAFT  1980's  . TONSILLECTOMY    . TOTAL HIP ARTHROPLASTY Right 07/29/2012   Procedure: RIGHT TOTAL HIP ARTHROPLASTY ANTERIOR APPROACH;  Surgeon: Mcarthur Rossetti, MD;  Location: [facility];  Service: Orthopedics;  Laterality: Right;  . TOTAL HIP ARTHROPLASTY Left 02/10/2019   Procedure: LEFT TOTAL HIP ARTHROPLASTY ANTERIOR APPROACH;  Surgeon: Mcarthur Rossetti, MD;  Location: [facility];  Service: Orthopedics;  Laterality: Left;    There were no vitals filed for this visit.  Subjective Assessment - 03/16/19 0936    Subjective  No questions or concerns since evaluation. Reports difficulty with SLR and hip abduction exercises on condensed HEP. States pain in L ITB - has previously had PT for ITB pain and did stretching, foam rolling, and dry needling.    Limitations  Sitting;Lifting;Standing;Walking;House hold  activities    Patient Stated Goals  Decreased pain in leg,  improved gait mechanics,    Currently in Pain?  Yes    Pain Score  0-No pain   this AM 5/10 when first waking   Pain Location  Hip    Pain Orientation  Left    Pain Descriptors / Indicators  Aching;Sore    Pain Type  Acute pain    Pain Onset  1 to 4 weeks ago    Pain Frequency  Intermittent    Pain Onset  More than a month ago         [facility] PT Assessment - 03/16/19 0942      ROM / Strength   AROM / PROM / Strength  PROM      PROM   Overall PROM Comments  -16* of L hip extension in supine Thomas test position                   [facility] Adult PT Treatment/Exercise - 03/16/19 0942      Transfers   Transfers  Sit to Stand;Stand to Sit    Sit to Stand  5: Supervision    Sit to Stand Details (indicate cue type and reason)  Pt performs with LLE extended in front of self using only RLE to propel self up into standing     Stand to Sit  5:  Supervision    Stand to Sit Details  Pt performs with LLE extended out in front of self with no wt through to control descent      Ambulation/Gait   Ambulation/Gait  Yes    Assistive device  None    Gait Pattern  Step-through pattern;Decreased step length - right;Decreased stance time - left;Decreased weight shift to left;Abducted - left;Wide base of support   dec L push off and L hip extension in stance   Ambulation Surface  Indoor;Level    Gait Comments  dec endruance, slow speed       Knee/Hip Exercises: Stretches   Hip Flexor Stretch  Left;10 seconds;5 reps    Hip Flexor Stretch Limitations  Supine modified thomas position with reps of L hip hanging on side of mat table - gravity assisted stretch. Cues for technique and how to increase stretch with R SKTC      Knee/Hip Exercises: Aerobic   Nustep  L5 x 15 minutes for AROM, muscular endurance, & warmup d/t morning stiffness in L hip       Knee/Hip Exercises: Standing   Heel Raises  2 sets;10 reps    Heel Raises  Limitations  1 set of reviewing HEP heel raise exercise with bilateral heel raise simultaneously. 1 set with RLE on step in cabinet to simulate pre-gait mechanics for improved L hip extension and push off. Cues for upright posture for technique.       Knee/Hip Exercises: Supine   Straight Leg Raises  Left;10 reps    Straight Leg Raises Limitations  Cues for posterior pelvic tilt to stabilize core prior to initiation of SLR. Used strap to assist hip flexion d/t pt c/o of L hip pain with exercise.       Knee/Hip Exercises: Sidelying   Hip ABduction  Left;10 reps    Hip ABduction Limitations  Cues to continue LLE abduction in neutral position - not flexing at the hip or ER LLE             PT Education - 03/16/19 0944    Education Details  Discussed ice massage modality, foam rolling, and reviewed stretches for L ITB; educated on working on muscle endurance in WB limited positions (i.e. Nustep) at [employer] d/t accessibility; discussed gradual increase in alt step ascending & descending steps - getting comfortable with alt sequence ~4 steps towards bottom of staircase before trying to negotiate full 14 steps alt sequence d/t safety and muscular endurance.    Person(s) Educated  Patient    Methods  Explanation;Verbal cues;Demonstration    Comprehension  Verbalized understanding       PT Short Term Goals - 03/16/19 0903      PT SHORT TERM GOAL #1   Title  Pt to be independent with initial HEP    Time  2    Period  Weeks    Status  New    Target Date  03/29/19      PT SHORT TERM GOAL #2   Title  Pt to report decreased pain in L thigh to 0-3/10    Time  2    Period  Weeks    Status  New    Target Date  03/29/19        PT Long Term Goals - 03/16/19 0904      PT LONG TERM GOAL #1   Title  Pt to be independent with final HEP    Time  6    Period  Weeks    Status  New    Target Date  04/26/19      PT LONG TERM GOAL #2   Title  Pt to demo improved strength of L hip and LE to  at least 4+/5 to improve stability and ability for IADLS.    Time  6    Period  Weeks    Status  New    Target Date  04/26/19      PT LONG TERM GOAL #3   Title  Pt to demo ability for ambulation for community distances, with  mechanics WNL    Time  6    Period  Weeks    Status  New    Target Date  04/26/19      PT LONG TERM GOAL #4   Title  Pt to report decreased pain in L hip and LE to 0-2/10 with activity    Time  6    Period  Weeks    Status  New    Target Date  04/26/19            Plan - 03/16/19 0944    Clinical Impression Statement  PT session focused on continued education on activity modification/modalities, muscular endurance & review of difficult HEP exercises for correct form. Modified SLR with strap assist if needed d/t pain. Progressed heel raise exercise to simulate pre-gait mechanics for improved L hip extension and push off in terminal stance. Added modified thomas stretch/standard thomas stretch into HEP for hip flexor stretch on L as pt is limited in position lacking 16* of L hip extension. Pt can benefit from skilled therapy services to address deficits and progress towards functional goals.    Examination-Activity Limitations  Sit;Sleep;Squat;Stairs;Stand;Lift    Examination-Participation Restrictions  Cleaning;Shop;Community Activity;Driving;Laundry    Stability/Clinical Decision Making  Stable/Uncomplicated    Rehab Potential  Good    PT Frequency  2x / week    PT Duration  6 weeks    PT Treatment/Interventions  ADLs/Self Care Home Management;Cryotherapy;Electrical Stimulation;Iontophoresis 4mg /ml Dexamethasone;Moist Heat;Traction;Therapeutic exercise;Therapeutic activities;Functional mobility training;Stair training;Gait training;DME Instruction;Ultrasound;Balance training;Neuromuscular re-education;Patient/family education;Manual techniques;Taping;Dry needling;Passive range of motion;Spinal Manipulations;Joint Manipulations    PT Next Visit Plan  continue  LE stretches, strengthening, balance, assess & correct STS    PT Home Exercise Plan  Medbridge: [id number]    Consulted and Agree with Plan of Care  Patient       Patient will benefit from skilled therapeutic intervention in order to improve the following deficits and impairments:  Abnormal gait, Decreased endurance, Decreased activity tolerance, Decreased strength, Pain, Increased muscle spasms, Difficulty walking, Decreased mobility, Decreased balance, Decreased range of motion, Improper body mechanics  Visit Diagnosis: Pain in left hip  Muscle weakness (generalized)  Other abnormalities of gait and mobility     Problem List Patient Active Problem List   Diagnosis Date Noted  . Status post total replacement of left hip 02/10/2019  . H/O total hip arthroplasty 02/10/2019  . Unilateral primary osteoarthritis, left hip 02/09/2019  . Trochanteric bursitis, left hip 04/19/2017  . It band syndrome, left 04/19/2017  . Degenerative arthritis of hip 07/29/2012    Autumn Lindsey SPT 03/16/2019, 10:25 AM  [facility] [address] [place], [place], [address] Phone: [phone]   Fax:  [phone]  Name: Autumn Lindsey MRN: [id number] Date of Birth: Oct 10, 1950

## 2019-03-16 NOTE — Patient Instructions (Signed)
Access Code: [email or web address]  URL: [email or web address]  Date: 03/15/2019  Prepared by: Lyndee Hensen   Exercises Supine Heel Slides - 10 reps - 2 sets - 3x daily Single Knee to Chest Stretch - 3 reps - 30 hold - 2x daily Supine Hip Adduction Isometric with Ball - 10 reps - 2 sets - 2x daily Straight Leg Raise - 5-10 reps - 2 sets - 2x daily Sidelying Hip Abduction - 10 reps - 2 sets - 1x daily Seated Knee Extension AROM - 10 reps - 2 sets - 2x daily Standing Heel Raise - 10 reps - 2 sets - 1x daily Standing March with Counter Support - 10 reps - 2 sets - 2x daily Standing Hip Abduction - 10 reps - 2 sets - 1x daily

## 2019-03-16 NOTE — Patient Instructions (Signed)
Access Code: [email or web address]  URL: [email or web address]  Date: 03/16/2019  Prepared by: Jamey Reas   Exercises Supine Heel Slides - 10 reps - 2 sets - 3x daily Single Knee to Chest Stretch - 3 reps - 30 hold - 2x daily Supine Hip Adduction Isometric with Ball - 10 reps - 2 sets - 2x daily Straight Leg Raise - 5-10 reps - 2 sets - 2x daily Sidelying Hip Abduction - 10 reps - 2 sets - 1x daily Seated Knee Extension AROM - 10 reps - 2 sets - 2x daily Standing Heel Raise - 10 reps - 2 sets - 1x daily Standing March with Counter Support - 10 reps - 2 sets - 2x daily Standing Hip Abduction - 10 reps - 2 sets - 1x daily Hip Flexor Stretch at Edge of Bed - 5 reps - 1 sets - 15 seconds hold - 1-2x daily - 7x weekly

## 2019-03-16 NOTE — Therapy (Signed)
[facility] [address] [place], [place], [address] Phone: [phone]   Fax:  [phone]  Physical Therapy Evaluation  Patient Details  Name: Autumn Lindsey MRN: [id number] Date of Birth: 1950/09/10 Referring Provider (PT): Jean Rosenthal   Encounter Date: 03/15/2019  PT End of Session - 03/16/19 0901    Visit Number  1    Number of Visits  12    Date for PT Re-Evaluation  04/26/19    Authorization Type  BCBS    PT Start Time  [phone]    PT Stop Time  1013    PT Time Calculation (min)  36 min    Activity Tolerance  Patient tolerated treatment well    Behavior During Therapy  [facility] for tasks assessed/performed       Past Medical History:  Diagnosis Date  . Anemia   . Arthritis   . Depression   . GERD (gastroesophageal reflux disease)   . Headache(784.0)    in past  . History of melanoma excision   . PONV (postoperative nausea and vomiting)   . Unilateral primary osteoarthritis, left hip 02/09/2019    Past Surgical History:  Procedure Laterality Date  . APPENDECTOMY  1970's  . BREAST ENHANCEMENT SURGERY    . SKIN GRAFT  1980's  . TONSILLECTOMY    . TOTAL HIP ARTHROPLASTY Right 07/29/2012   Procedure: RIGHT TOTAL HIP ARTHROPLASTY ANTERIOR APPROACH;  Surgeon: Mcarthur Rossetti, MD;  Location: [facility];  Service: Orthopedics;  Laterality: Right;  . TOTAL HIP ARTHROPLASTY Left 02/10/2019   Procedure: LEFT TOTAL HIP ARTHROPLASTY ANTERIOR APPROACH;  Surgeon: Mcarthur Rossetti, MD;  Location: [facility];  Service: Orthopedics;  Laterality: Left;    There were no vitals filed for this visit.   Subjective Assessment - 03/15/19 0941    Subjective  Pt had L THA on 02/10/19.(anterior approach)  She had prevoius R THA as well. Pt states previous and current pain in L Leg, lateral thigh and shin, hoping this pain will be eliminated after THA. She has had pain in leg for a couple years. Has also had back injections which did not help.  She likes to [employer], jog.  Works at [employer] center at [employer], Working from home 4 hrs this week, will go back next week.    Limitations  Sitting;Lifting;Standing;Walking;House hold activities    Patient Stated Goals  Decreased pain in leg,  improved gait mechanics,    Currently in Pain?  Yes    Pain Score  2     Pain Location  Hip    Pain Orientation  Left    Pain Descriptors / Indicators  Aching    Pain Type  Acute pain    Pain Radiating Towards  pain up to 7/10 with sitting, gets stiff    Pain Onset  1 to 4 weeks ago    Pain Frequency  Intermittent    Aggravating Factors   sitting,    Multiple Pain Sites  Yes    Pain Score  4    Pain Location  Leg    Pain Orientation  Left    Pain Descriptors / Indicators  Aching    Pain Type  Chronic pain    Pain Onset  More than a month ago    Pain Frequency  Intermittent         [facility] PT Assessment - 03/16/19 0001      Assessment   Medical Diagnosis  L THA (anterior)  Referring Provider (PT)  Jean Rosenthal    Onset Date/Surgical Date  02/10/19    Prior Therapy  Home Health      Precautions   Precautions  Anterior Hip      Balance Screen   Has the patient fallen in the past 6 months  No      Prior Function   Level of Independence  Independent      Cognition   Overall Cognitive Status  Within Functional Limits for tasks assessed      ROM / Strength   AROM / PROM / Strength  AROM;Strength      AROM   AROM Assessment Site  Hip    Right/Left Hip  Left    Left Hip Flexion  105    Left Hip External Rotation   --   mild deficit   Left Hip Internal Rotation   --   mild deficit   Left Hip ABduction  --   mild deficit      Strength   Strength Assessment Site  Hip    Right/Left Hip  Left    Left Hip Flexion  3/5    Left Hip Extension  3/5    Left Hip External Rotation  4-/5    Left Hip Internal Rotation  4-/5    Left Hip ABduction  4-/5    Left Hip ADduction  4/5      Palpation   Palpation comment  Healing  incision, almost full closure, one area at distal end not fully closed yet      Ambulation/Gait   Gait Comments  Decreased hip extension on L in terminal stance, decreased endurance , slow speed                 Objective measurements completed on examination: See above findings.      [facility] PT Treatment/Exercise - 03/16/19 0001      Exercises   Exercises  Knee/Hip      Knee/Hip Exercises: Stretches   Other Knee/Hip Stretches  Single knee to chest ( for hip flexion) , light, 30 sec x2;       Knee/Hip Exercises: Standing   Heel Raises  20 reps    Hip Flexion  15 reps;Knee bent    Hip Abduction  10 reps;2 sets;Both      Knee/Hip Exercises: Seated   Long Arc Quad  10 reps;Both      Knee/Hip Exercises: Supine   Heel Slides  20 reps    Hip Adduction Isometric  15 reps    Hip Adduction Isometric Limitations  Ball Squeeze, light     Straight Leg Raises  10 reps;Both      Knee/Hip Exercises: Sidelying   Hip ABduction  10 reps;Both             PT Education - 03/16/19 0901    Education Details  PT POC, HEP, Exam findings.    Person(s) Educated  Patient    Methods  Explanation;Demonstration;Verbal cues;Handout    Comprehension  Verbalized understanding;Returned demonstration;Verbal cues required;Need further instruction       PT Short Term Goals - 03/16/19 0903      PT SHORT TERM GOAL #1   Title  Pt to be independent with initial HEP    Time  2    Period  Weeks    Status  New    Target Date  03/29/19      PT SHORT TERM GOAL #2   Title  Pt to report decreased pain in L thigh to 0-3/10    Time  2    Period  Weeks    Status  New    Target Date  03/29/19        PT Long Term Goals - 03/16/19 0904      PT LONG TERM GOAL #1   Title  Pt to be independent with final HEP    Time  6    Period  Weeks    Status  New    Target Date  04/26/19      PT LONG TERM GOAL #2   Title  Pt to demo improved strength of L hip and LE to at least 4+/5 to improve  stability and ability for IADLS.    Time  6    Period  Weeks    Status  New    Target Date  04/26/19      PT LONG TERM GOAL #3   Title  Pt to demo ability for ambulation for community distances, with  mechanics WNL    Time  6    Period  Weeks    Status  New    Target Date  04/26/19      PT LONG TERM GOAL #4   Title  Pt to report decreased pain in L hip and LE to 0-2/10 with activity    Time  6    Period  Weeks    Status  New    Target Date  04/26/19             Plan - 03/16/19 0900    Clinical Impression Statement  Pt presents with deficits following L THA on 02/10/19. Pt with increased pain in L hip as well as thigh. She has decreased ROM and decreased strength of L hip and LE. She has decreased gait mechanics, and endurance due to weakness and pain. Pt with decreased ability for full functional activities, and will benefit from skilled PT to improve.    Examination-Activity Limitations  Sit;Sleep;Squat;Stairs;Stand;Lift    Examination-Participation Restrictions  Cleaning;Shop;Community Activity;Driving;Laundry    Stability/Clinical Decision Making  Stable/Uncomplicated    Clinical Decision Making  Low    Rehab Potential  Good    PT Frequency  2x / week    PT Duration  6 weeks    PT Treatment/Interventions  ADLs/Self Care Home Management;Cryotherapy;Electrical Stimulation;Iontophoresis 4mg /ml Dexamethasone;Moist Heat;Traction;Therapeutic exercise;Therapeutic activities;Functional mobility training;Stair training;Gait training;DME Instruction;Ultrasound;Balance training;Neuromuscular re-education;Patient/family education;Manual techniques;Taping;Dry needling;Passive range of motion;Spinal Manipulations;Joint Manipulations    PT Home Exercise Plan  [email or web address].  Pt had extensive HEP from home health. Condensed today.    Consulted and Agree with Plan of Care  Patient       Patient will benefit from skilled therapeutic intervention in order to improve the following deficits and  impairments:  Abnormal gait, Decreased endurance, Decreased activity tolerance, Decreased strength, Pain, Increased muscle spasms, Difficulty walking, Decreased mobility, Decreased balance, Decreased range of motion, Improper body mechanics  Visit Diagnosis: Pain in left hip  Muscle weakness (generalized)  Other abnormalities of gait and mobility     Problem List Patient Active Problem List   Diagnosis Date Noted  . Status post total replacement of left hip 02/10/2019  . H/O total hip arthroplasty 02/10/2019  . Unilateral primary osteoarthritis, left hip 02/09/2019  . Trochanteric bursitis, left hip 04/19/2017  . It band syndrome, left 04/19/2017  . Degenerative arthritis of hip 07/29/2012    Lyndee Hensen, PT, DPT 9:14 AM  03/16/19    [facility] [address] [place], [place], [address] Phone: [phone]   Fax:  [phone]  Name: Talliyah Ventrone MRN: [id number] Date of Birth: Jul 31, 1950

## 2019-03-20 ENCOUNTER — Encounter: Payer: Self-pay | Admitting: Orthopaedic Surgery

## 2019-03-22 ENCOUNTER — Encounter: Payer: BC Managed Care – PPO | Admitting: Physical Therapy

## 2019-03-23 ENCOUNTER — Ambulatory Visit (INDEPENDENT_AMBULATORY_CARE_PROVIDER_SITE_OTHER): Payer: BC Managed Care – PPO | Admitting: Physical Therapy

## 2019-03-23 ENCOUNTER — Other Ambulatory Visit: Payer: Self-pay

## 2019-03-23 ENCOUNTER — Encounter: Payer: Self-pay | Admitting: Physical Therapy

## 2019-03-23 DIAGNOSIS — R2681 Unsteadiness on feet: Secondary | ICD-10-CM | POA: Diagnosis not present

## 2019-03-23 DIAGNOSIS — M6281 Muscle weakness (generalized): Secondary | ICD-10-CM

## 2019-03-23 DIAGNOSIS — R2689 Other abnormalities of gait and mobility: Secondary | ICD-10-CM | POA: Diagnosis not present

## 2019-03-23 NOTE — Therapy (Signed)
[name] [address] [place], [place], [address] Phone: [phone]   Fax:  [phone]  Physical Therapy Treatment  Patient Details  Name: Autumn Lindsey MRN: [id number] Date of Birth: [birth year] Referring Provider (PT): Jean Rosenthal   Encounter Date: 03/23/2019  PT End of Session - 03/23/19 1035    Visit Number  3    Number of Visits  12    Date for PT Re-Evaluation  04/26/19    Authorization Type  BCBS    PT Start Time  0930    PT Stop Time  1010    PT Time Calculation (min)  40 min    Activity Tolerance  Patient tolerated treatment well    Behavior During Therapy  [facility] for tasks assessed/performed       Past Medical History:  Diagnosis Date  . Anemia   . Arthritis   . Depression   . GERD (gastroesophageal reflux disease)   . Headache(784.0)    in past  . History of melanoma excision   . PONV (postoperative nausea and vomiting)   . Unilateral primary osteoarthritis, left hip 02/09/2019    Past Surgical History:  Procedure Laterality Date  . APPENDECTOMY  1970's  . BREAST ENHANCEMENT SURGERY    . SKIN GRAFT  1980's  . TONSILLECTOMY    . TOTAL HIP ARTHROPLASTY Right 07/29/2012   Procedure: RIGHT TOTAL HIP ARTHROPLASTY ANTERIOR APPROACH;  Surgeon: Mcarthur Rossetti, MD;  Location: [facility];  Service: Orthopedics;  Laterality: Right;  . TOTAL HIP ARTHROPLASTY Left 02/10/2019   Procedure: LEFT TOTAL HIP ARTHROPLASTY ANTERIOR APPROACH;  Surgeon: Mcarthur Rossetti, MD;  Location: [facility];  Service: Orthopedics;  Laterality: Left;    There were no vitals filed for this visit.  Subjective Assessment - 03/23/19 0930    Subjective  She is doing stairs alternating without issues.    Limitations  Sitting;Lifting;Standing;Walking;House hold activities    Patient Stated Goals  Decreased pain in leg,  improved gait mechanics,    Currently in Pain?  No/denies    Pain Onset  1 to 4 weeks ago    Pain Onset  More than a  month ago                       [facility] Adult PT Treatment/Exercise - 03/23/19 0930      Transfers   Transfers  Sit to Stand;Stand to Sit    Sit to Stand  5: Supervision    Stand to Sit  5: Supervision      Ambulation/Gait   Ambulation/Gait  Yes    Ambulation/Gait Assistance  5: Supervision    Ambulation/Gait Assistance Details  PT demo & verbal cues on turning 90* & 180* for direct changes with proper rotation movement    Assistive device  None    Gait Pattern  Step-through pattern;Decreased step length - right;Decreased stance time - left;Decreased weight shift to left;Abducted - left;Wide base of support   dec L push off and L hip extension in stance   Ambulation Surface  Indoor;Level    Stairs  Yes    Stairs Assistance  6: Modified independent (Device/Increase time)    Stair Management Technique  One rail Right;Alternating pattern;Forwards    Number of Stairs  12    Gait Comments  --      Self-Care   Self-Care  ADL's    ADL's  verbal cues with pt performing simulated donning socks & tie shoe with  foot on 8" step stool. PT verbal cues.         Therapeutic Activites    Therapeutic Activities  ADL's    ADL's  PT verbal & demo cues on laying sidelying using pillow to position in neutral not adducting / crossing upper LE.       Knee/Hip Exercises: Stretches   Hip Flexor Stretch  Left;20 seconds;3 reps    Hip Flexor Stretch Limitations  Supine modified thomas position with reps of L hip hanging on side of mat table - gravity assisted stretch. Cues for technique and how to increase stretch with R SKTC    Other Knee/Hip Stretches  supine knee to chest with external rotation 15 sec hold 3 reps      Knee/Hip Exercises: Aerobic   Nustep  L5 x 6 minutes as warm-up for AROM, muscular endurance, & warmup d/t morning stiffness in L hip       Knee/Hip Exercises: Standing   Heel Raises  --    Heel Raises Limitations  --    Hip Flexion  AROM;Right;Left;10 reps;Knee  straight;Other (comment)   alternating LEs for alt wt shift-open chain to closed chain   Hip Flexion Limitations  cues on upright posture    Hip Abduction  AROM;Right;Left;10 reps;Knee straight;Other (comment)   alternating LEs for alt wt shift-open chain to closed chain   Abduction Limitations  cues on upright posture    Hip Extension  AROM;Right;Left;10 reps;Knee straight;Other (comment)   alternating LEs for alt wt shift-open chain to closed chain   Extension Limitations  cues on upright posture      Knee/Hip Exercises: Supine   Straight Leg Raises  --    Straight Leg Raises Limitations  --      Knee/Hip Exercises: Sidelying   Hip ABduction  --    Hip ABduction Limitations  --          Balance Exercises - 03/23/19 1032      Balance Exercises: Standing   Rockerboard  Anterior/posterior;Lateral;EO;10 reps;Intermittent UE support    Rockerboard Limitations  tactile cues on hip strategy for balance reactions.           PT Short Term Goals - 03/16/19 0903      PT SHORT TERM GOAL #1   Title  Pt to be independent with initial HEP    Time  2    Period  Weeks    Status  New    Target Date  03/29/19      PT SHORT TERM GOAL #2   Title  Pt to report decreased pain in L thigh to 0-3/10    Time  2    Period  Weeks    Status  New    Target Date  03/29/19        PT Long Term Goals - 03/16/19 0904      PT LONG TERM GOAL #1   Title  Pt to be independent with final HEP    Time  6    Period  Weeks    Status  New    Target Date  04/26/19      PT LONG TERM GOAL #2   Title  Pt to demo improved strength of L hip and LE to at least 4+/5 to improve stability and ability for IADLS.    Time  6    Period  Weeks    Status  New    Target Date  04/26/19  PT LONG TERM GOAL #3   Title  Pt to demo ability for ambulation for community distances, with  mechanics WNL    Time  6    Period  Weeks    Status  New    Target Date  04/26/19      PT LONG TERM GOAL #4   Title  Pt  to report decreased pain in L hip and LE to 0-2/10 with activity    Time  6    Period  Weeks    Status  New    Target Date  04/26/19            Plan - 03/23/19 1036    Clinical Impression Statement  Today's PT session focused on progressing hip control activites in standing / closed chain position including turning 90* & 180*, alternate LE kicks and standing balance with hip strategy.    Examination-Activity Limitations  Sit;Sleep;Squat;Stairs;Stand;Lift    Examination-Participation Restrictions  Cleaning;Shop;Community Activity;Driving;Laundry    Stability/Clinical Decision Making  Stable/Uncomplicated    Rehab Potential  Good    PT Frequency  2x / week    PT Duration  6 weeks    PT Treatment/Interventions  ADLs/Self Care Home Management;Cryotherapy;Electrical Stimulation;Iontophoresis 4mg /ml Dexamethasone;Moist Heat;Traction;Therapeutic exercise;Therapeutic activities;Functional mobility training;Stair training;Gait training;DME Instruction;Ultrasound;Balance training;Neuromuscular re-education;Patient/family education;Manual techniques;Taping;Dry needling;Passive range of motion;Spinal Manipulations;Joint Manipulations    PT Next Visit Plan  continue LE stretches, strengthening, balance, check STGs    PT Home Exercise Plan  [employer]: [email or web address]    Consulted and Agree with Plan of Care  Patient       Patient will benefit from skilled therapeutic intervention in order to improve the following deficits and impairments:  Abnormal gait, Decreased endurance, Decreased activity tolerance, Decreased strength, Pain, Increased muscle spasms, Difficulty walking, Decreased mobility, Decreased balance, Decreased range of motion, Improper body mechanics  Visit Diagnosis: Other abnormalities of gait and mobility  Unsteadiness on feet  Muscle weakness     Problem List Patient Active Problem List   Diagnosis Date Noted  . Status post total replacement of left hip 02/10/2019  . H/O total  hip arthroplasty 02/10/2019  . Unilateral primary osteoarthritis, left hip 02/09/2019  . Trochanteric bursitis, left hip 04/19/2017  . It band syndrome, left 04/19/2017  . Degenerative arthritis of hip 07/29/2012    Jamey Reas PT, DPT 03/23/2019, 10:40 AM  [facility] [address] [place], [place], [address] Phone: [phone]   Fax:  [phone]  Name: Savy Seratt MRN: [id number] Date of Birth: 1950/09/24

## 2019-03-29 ENCOUNTER — Other Ambulatory Visit: Payer: Self-pay

## 2019-03-29 ENCOUNTER — Encounter: Payer: Self-pay | Admitting: Orthopaedic Surgery

## 2019-03-29 ENCOUNTER — Ambulatory Visit (INDEPENDENT_AMBULATORY_CARE_PROVIDER_SITE_OTHER): Payer: BC Managed Care – PPO | Admitting: Orthopaedic Surgery

## 2019-03-29 DIAGNOSIS — Z96642 Presence of left artificial hip joint: Secondary | ICD-10-CM

## 2019-03-29 NOTE — Progress Notes (Signed)
Ms. Fabian is now 6 weeks status post a left total hip arthroplasty.  When she replaced her right hip I believe in 2014.  She feels like she is getting there.  She is in physical therapy to work with range of motion and strength.  She still has a lot of start up pain when she has been sitting down and first gets up but overall she is making progress compared to her last visit with [place] she states.  She is walking without an assistive device.  She is very active 69 year old.  On exam her left hip does move smoothly and more fluid than it did the last time but it does feel equal to her other side.  I gave her reassurance that she should continue to improve with time.  All question concerns were answered and addressed.  We really do not need to see her back for 6 months unless there are issues.  At that visit I would like a low AP pelvis and a lateral of her left operative hip.

## 2019-03-31 ENCOUNTER — Ambulatory Visit (INDEPENDENT_AMBULATORY_CARE_PROVIDER_SITE_OTHER): Payer: BC Managed Care – PPO | Admitting: Physical Therapy

## 2019-03-31 ENCOUNTER — Other Ambulatory Visit: Payer: Self-pay

## 2019-03-31 DIAGNOSIS — M6281 Muscle weakness (generalized): Secondary | ICD-10-CM

## 2019-03-31 DIAGNOSIS — R2681 Unsteadiness on feet: Secondary | ICD-10-CM | POA: Diagnosis not present

## 2019-03-31 DIAGNOSIS — M25552 Pain in left hip: Secondary | ICD-10-CM

## 2019-03-31 DIAGNOSIS — R2689 Other abnormalities of gait and mobility: Secondary | ICD-10-CM

## 2019-03-31 NOTE — Patient Instructions (Signed)
Access Code: [email or web address]  URL: [email or web address]  Date: 03/31/2019  Prepared by: Elsie Ra   Exercises  Supine Single Knee to Chest - 3 sets - 30 hold - 2x daily - 6x weekly  Supine ITB Stretch with Strap - 2-3 reps - 30 hold - 2x daily - 6x weekly  Seated Piriformis Stretch with Trunk Bend - 2 reps - 1 sets - 30 hold - 2x daily - 6x weekly  Hip Flexor Stretch at Edge of Bed - 5 reps - 1 sets - 15 seconds hold - 1-2x daily - 7x weekly  Figure 4 Bridge - 10 reps - 1-2 sets - 2x daily - 6x weekly  Straight Leg Raise - 5-10 reps - 2 sets - 2x daily  Sidelying Hip Abduction - 10 reps - 2 sets - 1x daily  Mini Squat with Counter Support - 10 reps - 1-2 sets - 2x daily - 6x weekly  Hip Abduction with Resistance Loop - 10 reps - 1-2 sets - 2x daily - 6x weekly  Hip Extension with Resistance Loop - 10 reps - 1-2 sets - 2x daily - 6x weekly  Standing Hamstring Curl with Resistance - 10 reps - 1-2 sets - 2x daily - 6x weekly  Standing 3-Way Leg Reach with Resistance at Ankles and Unilateral Counter Support - 10 reps - 3 sets - 2x daily - 6x weekly

## 2019-03-31 NOTE — Therapy (Signed)
[facility] Physical Therapy [address] [place], [place], [address] Phone: [phone]   Fax:  [phone]  Physical Therapy Treatment  Patient Details  Name: Autumn Lindsey MRN: [id number] Date of Birth: 1950-07-02 Referring Provider (PT): Jean Rosenthal   Encounter Date: 03/31/2019  PT End of Session - 03/31/19 1150    Visit Number  4    Number of Visits  12    Date for PT Re-Evaluation  04/26/19    Authorization Type  BCBS    PT Start Time  1102    PT Stop Time  1147    PT Time Calculation (min)  45 min    Activity Tolerance  Patient tolerated treatment well    Behavior During Therapy  [facility] for tasks assessed/performed       Past Medical History:  Diagnosis Date  . Anemia   . Arthritis   . Depression   . GERD (gastroesophageal reflux disease)   . Headache(784.0)    in past  . History of melanoma excision   . PONV (postoperative nausea and vomiting)   . Unilateral primary osteoarthritis, left hip 02/09/2019    Past Surgical History:  Procedure Laterality Date  . APPENDECTOMY  1970's  . BREAST ENHANCEMENT SURGERY    . SKIN GRAFT  1980's  . TONSILLECTOMY    . TOTAL HIP ARTHROPLASTY Right 07/29/2012   Procedure: RIGHT TOTAL HIP ARTHROPLASTY ANTERIOR APPROACH;  Surgeon: Mcarthur Rossetti, MD;  Location: [facility];  Service: Orthopedics;  Laterality: Right;  . TOTAL HIP ARTHROPLASTY Left 02/10/2019   Procedure: LEFT TOTAL HIP ARTHROPLASTY ANTERIOR APPROACH;  Surgeon: Mcarthur Rossetti, MD;  Location: [facility];  Service: Orthopedics;  Laterality: Left;    There were no vitals filed for this visit.  Subjective Assessment - 03/31/19 1148    Subjective  She feels she needs to have revised HEP as some are too easy now and she needs balance activity. She says still having some pain/stiffness with don/doff socks or with standing up from sitting    Limitations  Sitting;Lifting;Standing;Walking;House hold activities    Patient Stated Goals   Decreased pain in leg,  improved gait mechanics,    Pain Onset  1 to 4 weeks ago    Pain Onset  More than a month ago          [facility] Adult PT Treatment/Exercise - 03/31/19 0001      Exercises   Exercises  Knee/Hip      Knee/Hip Exercises: Stretches   Hip Flexor Stretch  Left;3 reps;30 seconds    Hip Flexor Stretch Limitations  hip/quad stretch with strap leg off EOB    ITB Stretch  Left;3 reps;30 seconds    ITB Stretch Limitations  supine with strap    Other Knee/Hip Stretches  seated hip flexion stretch and seated pififormis stretch 30 sec  x1 ea      Knee/Hip Exercises: Aerobic   Recumbent Bike  6 min no resistance      Knee/Hip Exercises: Standing   Functional Squat Limitations  mini squat with bilat UE support with chair behind her with cues and demonstration to hip hinge like she is sitting back into chair. 15 reps     Other Standing Knee Exercises  hip abduction, hip extension, H.S. curls all with L2 band loop X 15 reps ea    Other Standing Knee Exercises  SLS on Lt with Rt leg kicks X 5 each direction for flex, abd, ext, with L2 band loop  Manual Therapy   Manual therapy comments  Lt hip PROM all planes to tolerance and long axis distraction with hip into flexion and ER             PT Education - 03/31/19 1149    Education Details  HEP revision and progression    Person(s) Educated  Patient    Methods  Explanation;Demonstration;Verbal cues;Handout    Comprehension  Verbalized understanding;Returned demonstration       PT Short Term Goals - 03/31/19 1159      PT SHORT TERM GOAL #1   Title  Pt to be independent with initial HEP    Baseline  met inital but needed to revise due to progress    Time  2    Period  Weeks    Status  On-going    Target Date  03/29/19      PT SHORT TERM GOAL #2   Title  Pt to report decreased pain in L thigh to 0-3/10    Baseline  pain can still get above a 3 at times    Time  2    Period  Weeks    Status  On-going     Target Date  03/29/19        PT Long Term Goals - 03/16/19 0904      PT LONG TERM GOAL #1   Title  Pt to be independent with final HEP    Time  6    Period  Weeks    Status  New    Target Date  04/26/19      PT LONG TERM GOAL #2   Title  Pt to demo improved strength of L hip and LE to at least 4+/5 to improve stability and ability for IADLS.    Time  6    Period  Weeks    Status  New    Target Date  04/26/19      PT LONG TERM GOAL #3   Title  Pt to demo ability for ambulation for community distances, with  mechanics WNL    Time  6    Period  Weeks    Status  New    Target Date  04/26/19      PT LONG TERM GOAL #4   Title  Pt to report decreased pain in L hip and LE to 0-2/10 with activity    Time  6    Period  Weeks    Status  New    Target Date  04/26/19            Plan - 03/31/19 1150    Clinical Impression Statement  Session focused on HEP revision and progression to include more closed chain and standing hip strength along with balance activity. she showed good return demonstration and technique with exercises without complaints of pain. She is overall progressing as expected post THA and will continue to benefit from skilled PT.    Examination-Activity Limitations  Sit;Sleep;Squat;Stairs;Stand;Lift    Examination-Participation Restrictions  Cleaning;Shop;Community Activity;Driving;Laundry    Stability/Clinical Decision Making  Stable/Uncomplicated    Rehab Potential  Good    PT Frequency  2x / week    PT Duration  6 weeks    PT Treatment/Interventions  ADLs/Self Care Home Management;Cryotherapy;Electrical Stimulation;Iontophoresis 76m/ml Dexamethasone;Moist Heat;Traction;Therapeutic exercise;Therapeutic activities;Functional mobility training;Stair training;Gait training;DME Instruction;Ultrasound;Balance training;Neuromuscular re-education;Patient/family education;Manual techniques;Taping;Dry needling;Passive range of motion;Spinal Manipulations;Joint  Manipulations    PT Next Visit Plan  continue LE stretches, strengthening, balance, check  STGs    PT Home Exercise Plan  Medbridge: [email or web address]    Consulted and Agree with Plan of Care  Patient       Patient will benefit from skilled therapeutic intervention in order to improve the following deficits and impairments:  Abnormal gait, Decreased endurance, Decreased activity tolerance, Decreased strength, Pain, Increased muscle spasms, Difficulty walking, Decreased mobility, Decreased balance, Decreased range of motion, Improper body mechanics  Visit Diagnosis: Other abnormalities of gait and mobility  Unsteadiness on feet  Muscle weakness  Pain in left hip  Muscle weakness (generalized)     Problem List Patient Active Problem List   Diagnosis Date Noted  . Status post total replacement of left hip 02/10/2019  . H/O total hip arthroplasty 02/10/2019  . Unilateral primary osteoarthritis, left hip 02/09/2019  . Trochanteric bursitis, left hip 04/19/2017  . It band syndrome, left 04/19/2017  . Degenerative arthritis of hip 07/29/2012    Silvestre Mesi 03/31/2019, 12:12 PM  [facility] Physical Therapy [address] [place], [place], [address] Phone: [phone]   Fax:  [phone]  Name: Lachanda Buczek MRN: [id number] Date of Birth: 05-02-1950

## 2019-04-04 ENCOUNTER — Encounter: Payer: BC Managed Care – PPO | Admitting: Physical Therapy

## 2019-04-06 ENCOUNTER — Other Ambulatory Visit: Payer: Self-pay

## 2019-04-06 ENCOUNTER — Encounter: Payer: BC Managed Care – PPO | Admitting: Physical Therapy

## 2019-04-06 ENCOUNTER — Ambulatory Visit: Payer: BC Managed Care – PPO | Admitting: Physical Therapy

## 2019-04-06 DIAGNOSIS — M25552 Pain in left hip: Secondary | ICD-10-CM

## 2019-04-06 DIAGNOSIS — M6281 Muscle weakness (generalized): Secondary | ICD-10-CM | POA: Diagnosis not present

## 2019-04-06 DIAGNOSIS — R2689 Other abnormalities of gait and mobility: Secondary | ICD-10-CM | POA: Diagnosis not present

## 2019-04-06 DIAGNOSIS — R2681 Unsteadiness on feet: Secondary | ICD-10-CM | POA: Diagnosis not present

## 2019-04-06 NOTE — Therapy (Signed)
[facility] Physical Therapy [address] [place], [place], [address] Phone: [phone]   Fax:  [phone]  Physical Therapy Treatment  Patient Details  Name: Autumn Lindsey MRN: [id number] Date of Birth: 02/28/1951 Referring Provider (PT): Jean Rosenthal   Encounter Date: 04/06/2019  PT End of Session - 04/06/19 1343    Visit Number  5    Number of Visits  12    Date for PT Re-Evaluation  04/26/19    Authorization Type  BCBS    PT Start Time  7517    PT Stop Time  1230    PT Time Calculation (min)  45 min    Activity Tolerance  Patient tolerated treatment well    Behavior During Therapy  [facility] for tasks assessed/performed       Past Medical History:  Diagnosis Date  . Anemia   . Arthritis   . Depression   . GERD (gastroesophageal reflux disease)   . Headache(784.0)    in past  . History of melanoma excision   . PONV (postoperative nausea and vomiting)   . Unilateral primary osteoarthritis, left hip 02/09/2019    Past Surgical History:  Procedure Laterality Date  . APPENDECTOMY  1970's  . BREAST ENHANCEMENT SURGERY    . SKIN GRAFT  1980's  . TONSILLECTOMY    . TOTAL HIP ARTHROPLASTY Right 07/29/2012   Procedure: RIGHT TOTAL HIP ARTHROPLASTY ANTERIOR APPROACH;  Surgeon: Mcarthur Rossetti, MD;  Location: [facility];  Service: Orthopedics;  Laterality: Right;  . TOTAL HIP ARTHROPLASTY Left 02/10/2019   Procedure: LEFT TOTAL HIP ARTHROPLASTY ANTERIOR APPROACH;  Surgeon: Mcarthur Rossetti, MD;  Location: [facility];  Service: Orthopedics;  Laterality: Left;    There were no vitals filed for this visit.  Subjective Assessment - 04/06/19 1335    Subjective  Relays her Lt hip is doing good, no longer having pain down her leg. She has been compliant with HEP and has been walking more and doing stairs. Denies pain upon arrival    Limitations  Sitting;Lifting;Standing;Walking;House hold activities    Patient Stated Goals  Decreased pain in  leg,  improved gait mechanics,    Pain Onset  1 to 4 weeks ago    Pain Onset  More than a month ago                       [facility] Adult PT Treatment/Exercise - 04/06/19 0001      Knee/Hip Exercises: Stretches   Hip Flexor Stretch  Left;3 reps;30 seconds    Hip Flexor Stretch Limitations  hip/quad stretch with strap leg off EOB      Knee/Hip Exercises: Aerobic   Recumbent Bike  7 min L2      Knee/Hip Exercises: Standing   Lateral Step Up  15 reps;Left;Hand Hold: 2;Step Height: 8"    Forward Step Up  Left;15 reps;Hand Hold: 2;Step Height: 6"    Step Down  Left;10 reps;Hand Hold: 2;Step Height: 8"    Other Standing Knee Exercises  stairs in hallway up/down one flight reciprocally, sidestepping with minisquat with L3 band around her knees in bars up/down X 3 reps    Other Standing Knee Exercises  SLS Lt 30 sec, then progressed to on airex 30 sec X 2 progressed to no airex but headturns      Knee/Hip Exercises: Supine   Single Leg Bridge  Left;2 sets;10 reps      Manual Therapy   Manual therapy comments  Lt hip PROM all planes to tolerance and long axis distraction with hip into flexion and ER               PT Short Term Goals - 03/31/19 1159      PT SHORT TERM GOAL #1   Title  Pt to be independent with initial HEP    Baseline  met inital but needed to revise due to progress    Time  2    Period  Weeks    Status  On-going    Target Date  03/29/19      PT SHORT TERM GOAL #2   Title  Pt to report decreased pain in L thigh to 0-3/10    Baseline  pain can still get above a 3 at times    Time  2    Period  Weeks    Status  On-going    Target Date  03/29/19        PT Long Term Goals - 03/16/19 0904      PT LONG TERM GOAL #1   Title  Pt to be independent with final HEP    Time  6    Period  Weeks    Status  New    Target Date  04/26/19      PT LONG TERM GOAL #2   Title  Pt to demo improved strength of L hip and LE to at least 4+/5 to improve  stability and ability for IADLS.    Time  6    Period  Weeks    Status  New    Target Date  04/26/19      PT LONG TERM GOAL #3   Title  Pt to demo ability for ambulation for community distances, with  mechanics WNL    Time  6    Period  Weeks    Status  New    Target Date  04/26/19      PT LONG TERM GOAL #4   Title  Pt to report decreased pain in L hip and LE to 0-2/10 with activity    Time  6    Period  Weeks    Status  New    Target Date  04/26/19            Plan - 04/06/19 1346    Clinical Impression Statement  Progressed her strengthening program without complaints. She is able to walk up/down stairs with reciprocal pattern without complaints. Gait is almost WNL but is painfree. ROM is doing well. At this point she may be ready for discharge after next week if her gait is WNL and she has no other complaints.    Examination-Activity Limitations  Sit;Sleep;Squat;Stairs;Stand;Lift    Examination-Participation Restrictions  Cleaning;Shop;Community Activity;Driving;Laundry    Stability/Clinical Decision Making  Stable/Uncomplicated    Rehab Potential  Good    PT Frequency  2x / week    PT Duration  6 weeks    PT Treatment/Interventions  ADLs/Self Care Home Management;Cryotherapy;Electrical Stimulation;Iontophoresis 1m/ml Dexamethasone;Moist Heat;Traction;Therapeutic exercise;Therapeutic activities;Functional mobility training;Stair training;Gait training;DME Instruction;Ultrasound;Balance training;Neuromuscular re-education;Patient/family education;Manual techniques;Taping;Dry needling;Passive range of motion;Spinal Manipulations;Joint Manipulations    PT Next Visit Plan  check goals, is gait WNL?    PT Home Exercise Plan  Medbridge: [email or web address]   Consulted and Agree with Plan of Care  Patient       Patient will benefit from skilled therapeutic intervention in order to improve the following deficits and impairments:  Abnormal gait, Decreased  endurance, Decreased activity  tolerance, Decreased strength, Pain, Increased muscle spasms, Difficulty walking, Decreased mobility, Decreased balance, Decreased range of motion, Improper body mechanics  Visit Diagnosis: Other abnormalities of gait and mobility  Unsteadiness on feet  Muscle weakness  Pain in left hip  Muscle weakness (generalized)     Problem List Patient Active Problem List   Diagnosis Date Noted  . Status post total replacement of left hip 02/10/2019  . H/O total hip arthroplasty 02/10/2019  . Unilateral primary osteoarthritis, left hip 02/09/2019  . Trochanteric bursitis, left hip 04/19/2017  . It band syndrome, left 04/19/2017  . Degenerative arthritis of hip 07/29/2012    Silvestre Mesi 04/06/2019, 2:01 PM  [facility] Physical Therapy [address] [place], [place], [address] Phone: [phone]   Fax:  [phone]  Name: Autumn Lindsey MRN: [id number] Date of Birth: 1951/02/04

## 2019-04-11 ENCOUNTER — Other Ambulatory Visit: Payer: Self-pay

## 2019-04-11 ENCOUNTER — Ambulatory Visit (INDEPENDENT_AMBULATORY_CARE_PROVIDER_SITE_OTHER): Payer: BC Managed Care – PPO | Admitting: Physical Therapy

## 2019-04-11 DIAGNOSIS — R2681 Unsteadiness on feet: Secondary | ICD-10-CM

## 2019-04-11 DIAGNOSIS — R2689 Other abnormalities of gait and mobility: Secondary | ICD-10-CM

## 2019-04-11 DIAGNOSIS — M25552 Pain in left hip: Secondary | ICD-10-CM

## 2019-04-11 DIAGNOSIS — M6281 Muscle weakness (generalized): Secondary | ICD-10-CM | POA: Diagnosis not present

## 2019-04-11 NOTE — Therapy (Signed)
[facility] Physical Therapy [address] [place], [place], [address] Phone: [phone]   Fax:  [phone]  Physical Therapy Treatment  Patient Details  Name: Autumn Lindsey MRN: [id number] Date of Birth: 22-May-1950 Referring Provider (PT): Jean Rosenthal   Encounter Date: 04/11/2019  PT End of Session - 04/11/19 2312    Visit Number  6    Number of Visits  12    Date for PT Re-Evaluation  04/26/19    Authorization Type  BCBS    PT Start Time  1400    PT Stop Time  0947    PT Time Calculation (min)  45 min    Activity Tolerance  Patient tolerated treatment well    Behavior During Therapy  [facility] for tasks assessed/performed       Past Medical History:  Diagnosis Date  . Anemia   . Arthritis   . Depression   . GERD (gastroesophageal reflux disease)   . Headache(784.0)    in past  . History of melanoma excision   . PONV (postoperative nausea and vomiting)   . Unilateral primary osteoarthritis, left hip 02/09/2019    Past Surgical History:  Procedure Laterality Date  . APPENDECTOMY  1970's  . BREAST ENHANCEMENT SURGERY    . SKIN GRAFT  1980's  . TONSILLECTOMY    . TOTAL HIP ARTHROPLASTY Right 07/29/2012   Procedure: RIGHT TOTAL HIP ARTHROPLASTY ANTERIOR APPROACH;  Surgeon: Mcarthur Rossetti, MD;  Location: [facility];  Service: Orthopedics;  Laterality: Right;  . TOTAL HIP ARTHROPLASTY Left 02/10/2019   Procedure: LEFT TOTAL HIP ARTHROPLASTY ANTERIOR APPROACH;  Surgeon: Mcarthur Rossetti, MD;  Location: [facility];  Service: Orthopedics;  Laterality: Left;    There were no vitals filed for this visit.  Subjective Assessment - 04/11/19 2305    Subjective  no pain upon arrival, feels she will likely be ready for discharge next visit    Limitations  Sitting;Lifting;Standing;Walking;House hold activities    Patient Stated Goals  Decreased pain in leg,  improved gait mechanics,    Currently in Pain?  No/denies    Pain Onset  1 to 4 weeks  ago    Pain Onset  More than a month ago       [facility] Adult PT Treatment/Exercise - 04/11/19 0001      Ambulation/Gait   Gait Comments  very slight decreased stance time on Lt LE, worked on march walking to emphasize longer stance time on Lt, with good carryover to normal gait      High Level Balance   High Level Balance Comments  march walking, tandem walk on foam, sidestepping on foam      Knee/Hip Exercises: Stretches   Hip Flexor Stretch  Left;3 reps;30 seconds    Hip Flexor Stretch Limitations  hip/quad stretch with strap leg off EOB    ITB Stretch  Left;3 reps;30 seconds    Piriformis Stretch  Left;2 reps;30 seconds      Knee/Hip Exercises: Aerobic   Recumbent Bike  7 min L2      Knee/Hip Exercises: Standing   Lateral Step Up  15 reps;Left;Step Height: 8";Hand Hold: 0    Forward Step Up  Left;15 reps;Step Height: 6";Hand Hold: 0    Step Down  Left;10 reps;Hand Hold: 2;Step Height: 8"    Other Standing Knee Exercises  lateralwalking with L3 green band around her ankles in bars up/down X 3 reps no UE support and with mini squat      Manual  Therapy   Manual therapy comments  Lt hip PROM all planes to tolerance               PT Short Term Goals - 03/31/19 1159      PT SHORT TERM GOAL #1   Title  Pt to be independent with initial HEP    Baseline  met inital but needed to revise due to progress    Time  2    Period  Weeks    Status  On-going    Target Date  03/29/19      PT SHORT TERM GOAL #2   Title  Pt to report decreased pain in L thigh to 0-3/10    Baseline  pain can still get above a 3 at times    Time  2    Period  Weeks    Status  On-going    Target Date  03/29/19        PT Long Term Goals - 03/16/19 0904      PT LONG TERM GOAL #1   Title  Pt to be independent with final HEP    Time  6    Period  Weeks    Status  New    Target Date  04/26/19      PT LONG TERM GOAL #2   Title  Pt to demo improved strength of L hip and LE to at least 4+/5 to  improve stability and ability for IADLS.    Time  6    Period  Weeks    Status  New    Target Date  04/26/19      PT LONG TERM GOAL #3   Title  Pt to demo ability for ambulation for community distances, with  mechanics WNL    Time  6    Period  Weeks    Status  New    Target Date  04/26/19      PT LONG TERM GOAL #4   Title  Pt to report decreased pain in L hip and LE to 0-2/10 with activity    Time  6    Period  Weeks    Status  New    Target Date  04/26/19            Plan - 04/11/19 2313    Clinical Impression Statement  She has made excellent progress with PT and has no pain today or complaints with her exercises. PT will assess goals and measurements next session to determine if she is ready for discharge.    Examination-Activity Limitations  Sit;Sleep;Squat;Stairs;Stand;Lift    Examination-Participation Restrictions  Cleaning;Shop;Community Activity;Driving;Laundry    Stability/Clinical Decision Making  Stable/Uncomplicated    Rehab Potential  Good    PT Frequency  2x / week    PT Duration  6 weeks    PT Treatment/Interventions  ADLs/Self Care Home Management;Cryotherapy;Electrical Stimulation;Iontophoresis 34m/ml Dexamethasone;Moist Heat;Traction;Therapeutic exercise;Therapeutic activities;Functional mobility training;Stair training;Gait training;DME Instruction;Ultrasound;Balance training;Neuromuscular re-education;Patient/family education;Manual techniques;Taping;Dry needling;Passive range of motion;Spinal Manipulations;Joint Manipulations    PT Next Visit Plan  discharge?    PT Home Exercise Plan  Medbridge: [email or web address]   Consulted and Agree with Plan of Care  Patient       Patient will benefit from skilled therapeutic intervention in order to improve the following deficits and impairments:  Abnormal gait, Decreased endurance, Decreased activity tolerance, Decreased strength, Pain, Increased muscle spasms, Difficulty walking, Decreased mobility, Decreased balance,  Decreased range of motion, Improper body mechanics  Visit  Diagnosis: Other abnormalities of gait and mobility  Unsteadiness on feet  Muscle weakness  Pain in left hip  Muscle weakness (generalized)     Problem List Patient Active Problem List   Diagnosis Date Noted  . Status post total replacement of left hip 02/10/2019  . H/O total hip arthroplasty 02/10/2019  . Unilateral primary osteoarthritis, left hip 02/09/2019  . Trochanteric bursitis, left hip 04/19/2017  . It band syndrome, left 04/19/2017  . Degenerative arthritis of hip 07/29/2012    Silvestre Mesi 04/11/2019, 11:15 PM  [facility] Physical Therapy [address] [place], [place], [address] Phone: [phone]   Fax:  [phone]  Name: Autumn Lindsey MRN: [id number] Date of Birth: 20-Apr-1950

## 2019-04-13 ENCOUNTER — Other Ambulatory Visit: Payer: Self-pay

## 2019-04-13 ENCOUNTER — Encounter: Payer: Self-pay | Admitting: Rehabilitative and Restorative Service Providers"

## 2019-04-13 ENCOUNTER — Ambulatory Visit (INDEPENDENT_AMBULATORY_CARE_PROVIDER_SITE_OTHER): Payer: BC Managed Care – PPO | Admitting: Rehabilitative and Restorative Service Providers"

## 2019-04-13 DIAGNOSIS — R2689 Other abnormalities of gait and mobility: Secondary | ICD-10-CM | POA: Diagnosis not present

## 2019-04-13 DIAGNOSIS — M6281 Muscle weakness (generalized): Secondary | ICD-10-CM | POA: Diagnosis not present

## 2019-04-13 DIAGNOSIS — R2681 Unsteadiness on feet: Secondary | ICD-10-CM

## 2019-04-13 DIAGNOSIS — M25552 Pain in left hip: Secondary | ICD-10-CM

## 2019-04-13 NOTE — Therapy (Signed)
[facility] Physical Therapy [address] [place], [place], [address] Phone: [phone]   Fax:  [phone]  Physical Therapy Treatment/Discharge Summary  Patient Details  Name: Autumn Lindsey MRN: [id number] Date of Birth: Oct 14, 1950 Referring Provider (PT): Jean Rosenthal   Encounter Date: 04/13/2019  PT End of Session - 04/13/19 1412    Visit Number  7    Number of Visits  12    Date for PT Re-Evaluation  04/26/19    Authorization Type  [employer]    PT Start Time  1315    PT Stop Time  1400    PT Time Calculation (min)  45 min    Activity Tolerance  Patient tolerated treatment well    Behavior During Therapy  [facility] for tasks assessed/performed       Past Medical History:  Diagnosis Date  . Anemia   . Arthritis   . Depression   . GERD (gastroesophageal reflux disease)   . Headache(784.0)    in past  . History of melanoma excision   . PONV (postoperative nausea and vomiting)   . Unilateral primary osteoarthritis, left hip 02/09/2019    Past Surgical History:  Procedure Laterality Date  . APPENDECTOMY  1970's  . BREAST ENHANCEMENT SURGERY    . SKIN GRAFT  1980's  . TONSILLECTOMY    . TOTAL HIP ARTHROPLASTY Right 07/29/2012   Procedure: RIGHT TOTAL HIP ARTHROPLASTY ANTERIOR APPROACH;  Surgeon: Mcarthur Rossetti, MD;  Location: [facility];  Service: Orthopedics;  Laterality: Right;  . TOTAL HIP ARTHROPLASTY Left 02/10/2019   Procedure: LEFT TOTAL HIP ARTHROPLASTY ANTERIOR APPROACH;  Surgeon: Mcarthur Rossetti, MD;  Location: [facility];  Service: Orthopedics;  Laterality: Left;    There were no vitals filed for this visit.  Subjective Assessment - 04/13/19 1330    Subjective  Pt. indicated feeling fine with progress and ready for d/c.  Pt. asked questions regarding which HEP to perform at home going forwared upon d/c.   No pain to report upon arrival.    Limitations  Sitting;Lifting;Standing;Walking;House hold activities    Patient Stated  Goals  Decreased pain in leg,  improved gait mechanics,    Pain Score  0-No pain    Pain Onset  1 to 4 weeks ago    Pain Onset  More than a month ago         [facility] PT Assessment - 04/13/19 1354      Functional Tests   Functional tests  Single leg stance;Other      Single Leg Stance   Comments  SLS 30 seconds bilat s deviation      Other:   Other/ Comments  tandem stance 30 seconds on floor s deviation bilat      Strength   Left Hip Flexion  5/5    Left Hip Extension  5/5    Left Hip External Rotation  5/5    Left Hip Internal Rotation  5/5    Left Hip ABduction  4+/5    Left Hip ADduction  5/5      Transfers   Sit to Stand Details (indicate cue type and reason)  18 inch chair s UE no deficit      Ambulation/Gait   Ambulation/Gait  Yes    Ambulation/Gait Assistance  7: Independent    Ambulation/Gait Assistance Details  Independent c no cues.    Gait Pattern  --   Mild reduction in stance on L LE     Balance  Balance Assessed  Yes                   [facility] Adult PT Treatment/Exercise - 04/13/19 1354      Knee/Hip Exercises: Aerobic   Recumbent Bike  10 mins lvl 3      Knee/Hip Exercises: Standing   Lateral Step Up  Left;3 sets;10 reps;Hand Hold: 0;Step Height: 6"    Forward Step Up  Step Height: 6";Hand Hold: 0;Both;10 reps;1 set    Functional Squat  3 sets;10 reps    Functional Squat Limitations  18 inch table touch    SLS  30 seconds x 3 bilat    Other Standing Knee Exercises  tandem ambulation forward and reverse 10 ft x 5 each on foam, tandem stance on foam 30 sec x 2 bilat, with head turns x 15 each    Other Standing Knee Exercises  standing lateral stepping 3 cones x 10 bilat      Knee/Hip Exercises: Sidelying   Hip ABduction  Strengthening;Left;1 set;10 reps             PT Education - 04/13/19 1412    Education Details  Reviewed HEP performance and frequency for d/c.    Person(s) Educated  Patient    Methods  Explanation     Comprehension  Verbalized understanding       PT Short Term Goals - 04/13/19 1415      PT SHORT TERM GOAL #1   Title  Pt to be independent with initial HEP    Time  2    Period  Weeks    Status  Achieved    Target Date  03/29/19      PT SHORT TERM GOAL #2   Title  Pt to report decreased pain in L thigh to 0-3/10    Time  2    Period  Weeks    Status  Achieved    Target Date  03/29/19        PT Long Term Goals - 04/13/19 1417      PT LONG TERM GOAL #1   Title  Pt to be independent with final HEP    Time  6    Period  Weeks    Status  Achieved      PT LONG TERM GOAL #2   Title  Pt to demo improved strength of L hip and LE to at least 4+/5 to improve stability and ability for IADLS.    Time  6    Period  Weeks    Status  Achieved      PT LONG TERM GOAL #3   Title  Pt to demo ability for ambulation for community distances, with  mechanics WNL    Time  6    Period  Weeks    Status  Achieved      PT LONG TERM GOAL #4   Title  Pt to report decreased pain in L hip and LE to 0-2/10 with activity    Time  6    Period  Weeks    Status  Achieved            Plan - 04/13/19 1414    Clinical Impression Statement  Pt. has demonstrated progress and reached most of estabilshed goals and is ready for d/c as previously discussed with Pt. on last visit.    Examination-Activity Limitations  Sit;Sleep;Squat;Stairs;Stand;Lift    Examination-Participation Restrictions  Cleaning;Shop;Community Activity;Driving;Laundry    Stability/Clinical Decision Making  Stable/Uncomplicated    Rehab Potential  Good    PT Frequency  2x / week    PT Duration  6 weeks    PT Treatment/Interventions  ADLs/Self Care Home Management;Cryotherapy;Electrical Stimulation;Iontophoresis 12m/ml Dexamethasone;Moist Heat;Traction;Therapeutic exercise;Therapeutic activities;Functional mobility training;Stair training;Gait training;DME Instruction;Ultrasound;Balance training;Neuromuscular  re-education;Patient/family education;Manual techniques;Taping;Dry needling;Passive range of motion;Spinal Manipulations;Joint Manipulations    PT Next Visit Plan  D/C from skilled PT services at this time.    PT Home Exercise Plan  Medbridge: [email or web address]   Consulted and Agree with Plan of Care  Patient       Patient will benefit from skilled therapeutic intervention in order to improve the following deficits and impairments:  Abnormal gait, Decreased endurance, Decreased activity tolerance, Decreased strength, Pain, Increased muscle spasms, Difficulty walking, Decreased mobility, Decreased balance, Decreased range of motion, Improper body mechanics  Visit Diagnosis: Other abnormalities of gait and mobility  Unsteadiness on feet  Muscle weakness  Pain in left hip  Muscle weakness (generalized)     Problem List Patient Active Problem List   Diagnosis Date Noted  . Status post total replacement of left hip 02/10/2019  . H/O total hip arthroplasty 02/10/2019  . Unilateral primary osteoarthritis, left hip 02/09/2019  . Trochanteric bursitis, left hip 04/19/2017  . It band syndrome, left 04/19/2017  . Degenerative arthritis of hip 07/29/2012    MScot Jun PT, DPT, OCS, ATC 04/13/19 2:26 PM    [facility] [address] [address]Phone: [phone]  Fax:  [phone] Name: Autumn IldefonsoMRN: [id number] of Birth: [birth year]  PHYSICAL THERAPY DISCHARGE SUMMARY  Visits from Start of Care: 7  Current functional level related to goals / functional outcomes: See above   Remaining deficits: See above   Education / Equipment: HEP Plan: Patient agrees to discharge.  Patient goals were met. Patient is being discharged due to being pleased with the current functional level.  ?????           MScot Jun PT, DPT, OCS, ATC 04/13/19 2:32 PM

## 2019-09-25 ENCOUNTER — Ambulatory Visit: Payer: BC Managed Care – PPO | Admitting: Orthopaedic Surgery

## 2019-12-25 ENCOUNTER — Encounter: Payer: Self-pay | Admitting: Orthopaedic Surgery

## 2019-12-25 ENCOUNTER — Ambulatory Visit (INDEPENDENT_AMBULATORY_CARE_PROVIDER_SITE_OTHER): Payer: BC Managed Care – PPO | Admitting: Orthopaedic Surgery

## 2019-12-25 ENCOUNTER — Ambulatory Visit: Payer: Self-pay

## 2019-12-25 VITALS — Ht 67.5 in | Wt 144.0 lb

## 2019-12-25 DIAGNOSIS — Z96642 Presence of left artificial hip joint: Secondary | ICD-10-CM

## 2019-12-25 DIAGNOSIS — M542 Cervicalgia: Secondary | ICD-10-CM

## 2019-12-25 MED ORDER — CYCLOBENZAPRINE HCL 10 MG PO TABS: 10.0000 mg | ORAL_TABLET | Freq: Every day | ORAL | 1 refills | Status: AC

## 2019-12-25 NOTE — Progress Notes (Signed)
Office Visit Note   Patient: Autumn Lindsey           Date of Birth: [birth year]           MRN: [id number] Visit Date: 12/25/2019              Requested by: Autumn Elk, MD [address] [place],  [place] [address] PCP: Autumn Elk, MD   Assessment & Plan: Visit Diagnoses:  1. Neck pain   2. Status post total replacement of left hip     Plan: We will send her to physical therapy for her neck.  They will work on range of motion strengthening modalities and home exercise program.  Also will place her on Flexeril 10 mg 1 p.o. nightly.  She is to make sure she is viewing monitors at the correct level not to hyperextend her neck.  Regards to her hips we will see her back on an as-needed basis.  Follow-Up Instructions: Return if symptoms worsen or fail to improve.   Orders:  Orders Placed This Encounter  Procedures  . XR HIP UNILAT W OR W/O PELVIS 2-3 VIEWS LEFT  . XR Cervical Spine 2 or 3 views   No orders of the defined types were placed in this encounter.     Procedures: No procedures performed   Clinical Data: No additional findings.   Subjective: Chief Complaint  Patient presents with  . Neck - Pain  . Left Hip - Follow-up    02/10/2019 Left THA    HPI Ms. Autumn Lindsey returns today status post left total hip arthroplasty 02/10/2019.  She states that her left hip is doing great.  She has no complaints.  Main complaint today is neck pain has been ongoing for the past 3 to 4 months no known injury.  States she has no radicular symptoms down either arm.  She relates the pain to be 5-6 out of 10 pain at worst.  She has tried heating pad and massage otherwise no treatment for the neck pain. Review of Systems Negative for fevers chills shortness of breath or chest pain.  No unintentional weight loss.  Objective: Vital Signs: Ht 5' 7.5" (1.715 m)   Wt 144 lb (65.3 kg)   BMI 22.22 kg/m   Physical Exam General: Well-developed well-nourished female no acute distress  mood affect appropriate Psych: Alert and oriented x3 Ortho Exam Cervical spine: Positive Spurling's test.  Pain with rotation to the left.  No pain with extension flexion cervical spine.  Nontender over the cervical spinal column and paraspinous region of the cervical spine or either medial border of the scapula.  Bilateral hands full sensation full motor.  5 out of 5 strength throughout the upper extremities against resistance. Specialty Comments:  No specialty comments available.  Imaging: XR HIP UNILAT W OR W/O PELVIS 2-3 VIEWS LEFT  Result Date: 12/25/2019 AP pelvis and lateral view of the left hip: No acute fractures.  Bilateral hips well located.  Status post bilateral total hip arthroplasties with well-seated components without any complicating features.  XR Cervical Spine 2 or 3 views  Result Date: 12/25/2019 Cervical spine 2 views: Degenerative changes throughout the mid cervical spine with endplate spurring and diminished disc space.  No acute fractures.  Loss of lordotic curvature.    PMFS History: Patient Active Problem List   Diagnosis Date Noted  . Status post total replacement of left hip 02/10/2019  . H/O total hip arthroplasty 02/10/2019  . Unilateral primary  osteoarthritis, left hip 02/09/2019  . Trochanteric bursitis, left hip 04/19/2017  . It band syndrome, left 04/19/2017  . Leukocytopenia 03/08/2013  . Macular degeneration (senile) of retina 11/23/2012  . Anemia 11/23/2012  . Anxiety state 08/13/2012  . 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 Disorder of muscle, ligament, and fascia 08/13/2012  . Enthesopathy of hip region 08/13/2012  . Degenerative arthritis of hip 07/29/2012  . Melanoma of skin ([place]) 05/07/2009  . Meningitis 05/07/2009   Past Medical History:  Diagnosis Date  . Anemia   . Arthritis   . Depression   . GERD (gastroesophageal reflux disease)   . Headache(784.0)    in past  . History of melanoma excision   . PONV (postoperative nausea  and vomiting)   . Unilateral primary osteoarthritis, left hip 02/09/2019    History reviewed. No pertinent family history.  Past Surgical History:  Procedure Laterality Date  . APPENDECTOMY  1970's  . BREAST ENHANCEMENT SURGERY    . SKIN GRAFT  1980's  . TONSILLECTOMY    . TOTAL HIP ARTHROPLASTY Right 07/29/2012   Procedure: RIGHT TOTAL HIP ARTHROPLASTY ANTERIOR APPROACH;  Surgeon: Mcarthur Rossetti, MD;  Location: [facility];  Service: Orthopedics;  Laterality: Right;  . TOTAL HIP ARTHROPLASTY Left 02/10/2019   Procedure: LEFT TOTAL HIP ARTHROPLASTY ANTERIOR APPROACH;  Surgeon: Mcarthur Rossetti, MD;  Location: [facility];  Service: Orthopedics;  Laterality: Left;   Social History   Occupational History  . Not on file  Tobacco Use  . Smoking status: Never Smoker  . Smokeless tobacco: Never Used  Vaping Use  . Vaping Use: Never used  Substance and Sexual Activity  . Alcohol use: Yes    Comment: occasional  . Drug use: No  . Sexual activity: Not on file

## 2019-12-26 ENCOUNTER — Other Ambulatory Visit: Payer: Self-pay

## 2019-12-26 DIAGNOSIS — M542 Cervicalgia: Secondary | ICD-10-CM

## 2020-01-08 ENCOUNTER — Ambulatory Visit: Payer: BC Managed Care – PPO | Admitting: Physical Therapy

## 2020-01-19 ENCOUNTER — Other Ambulatory Visit: Payer: Self-pay

## 2020-01-19 ENCOUNTER — Ambulatory Visit (INDEPENDENT_AMBULATORY_CARE_PROVIDER_SITE_OTHER): Payer: BC Managed Care – PPO | Admitting: Physical Therapy

## 2020-01-19 ENCOUNTER — Encounter: Payer: Self-pay | Admitting: Physical Therapy

## 2020-01-19 DIAGNOSIS — M6281 Muscle weakness (generalized): Secondary | ICD-10-CM

## 2020-01-19 DIAGNOSIS — M542 Cervicalgia: Secondary | ICD-10-CM | POA: Diagnosis not present

## 2020-01-19 NOTE — Patient Instructions (Signed)
Access Code: [email or web address] URL: [email or web address] Date: 01/19/2020 Prepared by: Elsie Ra  Exercises Seated Cervical Sidebending Stretch - 2 x daily - 6 x weekly - 1 sets - 2-3 reps - 30 sec hold Seated Levator Scapulae Stretch - 2 x daily - 6 x weekly - 2-3 reps - 30 hold Seated Assisted Cervical Rotation with Towel - 2 x daily - 6 x weekly - 1 sets - 10 reps - 5 hold Seated Thoracic Lumbar Extension - 2 x daily - 6 x weekly - 3 sets - 10 reps  Patient Education TENS Unit

## 2020-01-19 NOTE — Therapy (Signed)
[facility] Physical Therapy [address] [place], [place], [address] Phone: [phone]   Fax:  [phone]  Physical Therapy Evaluation  Patient Details  Name: Autumn Lindsey MRN: [id number] Date of Birth: 1950-09-20 Referring Provider (PT): Carney Bern   Encounter Date: 01/19/2020   PT End of Session - 01/19/20 0907    Visit Number 1    Number of Visits 12    Date for PT Re-Evaluation 03/01/20    PT Start Time 0805    PT Stop Time 0853    PT Time Calculation (min) 48 min           Past Medical History:  Diagnosis Date  . Anemia   . Arthritis   . Depression   . GERD (gastroesophageal reflux disease)   . Headache(784.0)    in past  . History of melanoma excision   . PONV (postoperative nausea and vomiting)   . Unilateral primary osteoarthritis, left hip 02/09/2019    Past Surgical History:  Procedure Laterality Date  . APPENDECTOMY  1970's  . BREAST ENHANCEMENT SURGERY    . SKIN GRAFT  1980's  . TONSILLECTOMY    . TOTAL HIP ARTHROPLASTY Right 07/29/2012   Procedure: RIGHT TOTAL HIP ARTHROPLASTY ANTERIOR APPROACH;  Surgeon: Mcarthur Rossetti, MD;  Location: [facility];  Service: Orthopedics;  Laterality: Right;  . TOTAL HIP ARTHROPLASTY Left 02/10/2019   Procedure: LEFT TOTAL HIP ARTHROPLASTY ANTERIOR APPROACH;  Surgeon: Mcarthur Rossetti, MD;  Location: [facility];  Service: Orthopedics;  Laterality: Left;    There were no vitals filed for this visit.    Subjective Assessment - 01/19/20 0810    Subjective She relays neck pain for last 5 months that started after looking at computer screens for too long. She is still working and plans to retire next june. 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 Pain is mostly on left side of her neck and she denies radiculopathy    Diagnostic tests neck XR "Degenerative changes throughout the mid cervical spine with endplate spurring and diminished disc space.  No acute fractures.  Loss of lordotic  curvature"    Patient Stated Goals reduce pain    Currently in Pain? Yes    Pain Score 4     Pain Location Neck    Pain Orientation Left    Pain Descriptors / Indicators Aching;Tightness    Pain Radiating Towards denies radiculopy    Pain Onset More than a month ago    Pain Frequency Intermittent    Aggravating Factors  looking up, bending or rotating her neck, sleeping    Pain Relieving Factors rest, heat, massage    Multiple Pain Sites No              [facility] PT Assessment - 01/19/20 0001      Assessment   Medical Diagnosis chronic neck pain    Referring Provider (PT) Carney Bern    Onset Date/Surgical Date --   neck pain for 5 months   Hand Dominance Right    Next MD Visit PRN    Prior Therapy nothing recent for neck      Precautions   Precautions None      Balance Screen   Has the patient fallen in the past 6 months No    Has the patient had a decrease in activity level because of a fear of falling?  No    Is the patient reluctant to leave their home because of a fear  of falling?  No      Home Ecologist residence      Prior Function   Level of Independence Independent    Vocation Full time employment    Vocation Requirements runs student health clinic      Cognition   Overall Cognitive Status Within Functional Limits for tasks assessed      Observation/Other Assessments   Focus on Therapeutic Outcomes (FOTO)  53%       Coordination   Gross Motor Movements are Fluid and Coordinated Yes      Posture/Postural Control   Posture Comments sits with good posture, some loss of cervical lordosis      ROM / Strength   AROM / PROM / Strength AROM;Strength      AROM   AROM Assessment Site Cervical    Cervical Flexion 35    Cervical Extension 40   pain on left side of the neck   Cervical - Right Side Bend 15   pain   Cervical - Left Side Bend 10   pain   Cervical - Right Rotation 50   pain   Cervical - Left Rotation 40   pain       Strength   Overall Strength Comments UE strength overall 5/5      Flexibility   Soft Tissue Assessment /Muscle Length --   tight cervical P.S, and upper trap Lt worse than Rt     Palpation   Palpation comment trigger points noted Lt upper trap      Special Tests   Other special tests neg spulings test, no pain with cervical PROM or C-T spine mobs                      Objective measurements completed on examination: See above findings.       [place] Adult PT Treatment/Exercise - 01/19/20 0001      Modalities   Modalities Electrical Stimulation;Moist Heat      Moist Heat Therapy   Number Minutes Moist Heat 12 Minutes    Moist Heat Location Cervical      Electrical Stimulation   Electrical Stimulation Location neck and left upper traps    Electrical Stimulation Action IFC    Electrical Stimulation Parameters tolerance 12 min     Electrical Stimulation Goals Pain      Manual Therapy   Manual therapy comments Trigger point release to Lt upper trap, cervvical PROM                  PT Education - 01/19/20 0906    Education Details HEP, TENS, POC, coop pillow recommendation, mckenzie roll recommendation    Person(s) Educated Patient    Methods Explanation;Demonstration;Verbal cues;Handout    Comprehension Verbalized understanding;Returned demonstration;Need further instruction               PT Long Term Goals - 01/19/20 0914      PT LONG TERM GOAL #1   Title Pt to be independent with final HEP    Time 6    Period Weeks    Status New    Target Date 03/01/20      PT LONG TERM GOAL #2   Title Pt will improve neck ROM to [facility].    Time 6    Period Weeks    Status New      PT LONG TERM GOAL #3   Title Pt will report improved sleep quality  due to less neck pain/discomfort    Time 6    Period Weeks    Status New      PT LONG TERM GOAL #4   Title Pt to report decreased neck pain to less than 2-3 overall with activity, work, neck ROM     Time 6    Period Weeks    Status New      PT LONG TERM GOAL #5   Title FOTO functional outcome score will improve to 63% indicating improved function with less pain.    Baseline 53    Time 6    Period Weeks    Status New                  Plan - 01/19/20 0908    Clinical Impression Statement Pt presents with signs and symptoms of chronic neck pain that is mostly muscular but some OA in nature. She does have trigger points in her Lt upper trap. She will benefit from skilled PT to address her functional deficits in cervical-thoracic ROM, mobility, strength, and to decrease pain.    Examination-Activity Limitations Carry;Lift;Reach Overhead    Examination-Participation Restrictions Driving;Occupation    Stability/Clinical Decision Making Stable/Uncomplicated    Clinical Decision Making Low    Rehab Potential Good    PT Frequency 2x / week   1-2   PT Duration 6 weeks    PT Treatment/Interventions ADLs/Self Care Home Management;Cryotherapy;Electrical Stimulation;Iontophoresis 4mg /ml Dexamethasone;Moist Heat;Traction;Ultrasound;Therapeutic exercise;Neuromuscular re-education;Manual techniques;Passive range of motion;Dry needling;Taping;Spinal Manipulations;Joint Manipulations    PT Next Visit Plan review and update HEP PRN, needs C-T spine mobility and stretching, consider manual or modalaties    PT Home Exercise Plan Access Code: [email or web address]    Consulted and Agree with Plan of Care Patient           Patient will benefit from skilled therapeutic intervention in order to improve the following deficits and impairments:  Decreased activity tolerance, Decreased mobility, Decreased range of motion, Decreased strength, Hypomobility, Impaired flexibility, Increased muscle spasms, Increased fascial restricitons, Pain  Visit Diagnosis: Cervicalgia  Muscle weakness (generalized)     Problem List Patient Active Problem List   Diagnosis Date Noted  . Status post total replacement of  left hip 02/10/2019  . H/O total hip arthroplasty 02/10/2019  . Unilateral primary osteoarthritis, left hip 02/09/2019  . Trochanteric bursitis, left hip 04/19/2017  . It band syndrome, left 04/19/2017  . Leukocytopenia 03/08/2013  . Macular degeneration (senile) of retina 11/23/2012  . Anemia 11/23/2012  . Anxiety state 08/13/2012  . 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 Disorder of muscle, ligament, and fascia 08/13/2012  . Enthesopathy of hip region 08/13/2012  . Degenerative arthritis of hip 07/29/2012  . Melanoma of skin ([place]) 05/07/2009  . Meningitis 05/07/2009    Autumn Lindsey 01/19/2020, 9:19 AM  [facility] Physical Therapy [address] [place], [place], [address] Phone: [phone]   Fax:  [phone]  Name: Autumn Lindsey MRN: [id number] Date of Birth: 1950-04-22

## 2020-01-26 ENCOUNTER — Ambulatory Visit (INDEPENDENT_AMBULATORY_CARE_PROVIDER_SITE_OTHER): Payer: BC Managed Care – PPO | Admitting: Physical Therapy

## 2020-01-26 ENCOUNTER — Other Ambulatory Visit: Payer: Self-pay

## 2020-01-26 DIAGNOSIS — M6281 Muscle weakness (generalized): Secondary | ICD-10-CM | POA: Diagnosis not present

## 2020-01-26 DIAGNOSIS — M542 Cervicalgia: Secondary | ICD-10-CM | POA: Diagnosis not present

## 2020-01-26 DIAGNOSIS — R2689 Other abnormalities of gait and mobility: Secondary | ICD-10-CM | POA: Diagnosis not present

## 2020-01-26 NOTE — Therapy (Signed)
[facility] Physical Therapy [address] [place], [place], [address] Phone: [phone]   Fax:  [phone]  Physical Therapy Treatment  Patient Details  Name: Autumn Lindsey MRN: [id number] Date of Birth: 13-Apr-1950 Referring Provider (PT): Carney Bern   Encounter Date: 01/26/2020   PT End of Session - 01/26/20 0834    Visit Number 2    Number of Visits 12    Date for PT Re-Evaluation 03/01/20    PT Start Time 0801    PT Stop Time 3709    PT Time Calculation (min) 43 min    Activity Tolerance Patient tolerated treatment well    Behavior During Therapy [facility] for tasks assessed/performed           Past Medical History:  Diagnosis Date  . Anemia   . Arthritis   . Depression   . GERD (gastroesophageal reflux disease)   . Headache(784.0)    in past  . History of melanoma excision   . PONV (postoperative nausea and vomiting)   . Unilateral primary osteoarthritis, left hip 02/09/2019    Past Surgical History:  Procedure Laterality Date  . APPENDECTOMY  1970's  . BREAST ENHANCEMENT SURGERY    . SKIN GRAFT  1980's  . TONSILLECTOMY    . TOTAL HIP ARTHROPLASTY Right 07/29/2012   Procedure: RIGHT TOTAL HIP ARTHROPLASTY ANTERIOR APPROACH;  Surgeon: Mcarthur Rossetti, MD;  Location: [facility];  Service: Orthopedics;  Laterality: Right;  . TOTAL HIP ARTHROPLASTY Left 02/10/2019   Procedure: LEFT TOTAL HIP ARTHROPLASTY ANTERIOR APPROACH;  Surgeon: Mcarthur Rossetti, MD;  Location: [facility];  Service: Orthopedics;  Laterality: Left;    There were no vitals filed for this visit.   Subjective Assessment - 01/26/20 0813    Subjective she relays her neck pain is overall better today about 3/10. Relays some compliance with HEP    Diagnostic tests neck XR "Degenerative changes throughout the mid cervical spine with endplate spurring and diminished disc space.  No acute fractures.  Loss of lordotic curvature"    Patient Stated Goals reduce pain    Pain  Onset More than a month ago                             [facility] Adult PT Treatment/Exercise - 01/26/20 0001      Exercises   Exercises Neck      Neck Exercises: Machines for Strengthening   UBE (Upper Arm Bike) L3, 2.5 min fwd, 2.5 min retro      Neck Exercises: Theraband   Shoulder Extension 20 reps;Red    Rows 20 reps;Red      Neck Exercises: Seated   Neck Retraction 15 reps    Cervical Rotation --    Cervical Rotation Limitations --    Other Seated Exercise seated thoracic extensions X 10 holding 5 sec over 1/2 roll      Moist Heat Therapy   Number Minutes Moist Heat 15 Minutes    Moist Heat Location Cervical      Electrical Stimulation   Electrical Stimulation Location neck and left upper traps    Electrical Stimulation Action IFC    Electrical Stimulation Parameters tolerance, 15 min     Electrical Stimulation Goals Pain      Manual Therapy   Manual therapy comments Trigger point release to Lt upper trap, STM/IASTM      Neck Exercises: Stretches   Upper Trapezius Stretch Left;2 reps;30 seconds  Upper Trapezius Stretch Limitations one on Rt    Levator Stretch Left;2 reps;30 seconds    Levator Stretch Limitations one on Rt                       PT Long Term Goals - 01/19/20 0914      PT LONG TERM GOAL #1   Title Pt to be independent with final HEP    Time 6    Period Weeks    Status New    Target Date 03/01/20      PT LONG TERM GOAL #2   Title Pt will improve neck ROM to [facility].    Time 6    Period Weeks    Status New      PT LONG TERM GOAL #3   Title Pt will report improved sleep quality due to less neck pain/discomfort    Time 6    Period Weeks    Status New      PT LONG TERM GOAL #4   Title Pt to report decreased neck pain to less than 2-3 overall with activity, work, neck ROM    Time 6    Period Weeks    Status New      PT LONG TERM GOAL #5   Title FOTO functional outcome score will improve to 63% indicating  improved function with less pain.    Baseline 53    Time 6    Period Weeks    Status New                 Plan - 01/26/20 0836    Clinical Impression Statement Worked on cervical/shoulder ROM, stretching, and light strengthening today with good overall tolerance. Appears to have early benefit from manual therapy and TENS to reduce overall pain. Continue POC.    Examination-Activity Limitations Carry;Lift;Reach Overhead    Examination-Participation Restrictions Driving;Occupation    Stability/Clinical Decision Making Stable/Uncomplicated    Rehab Potential Good    PT Frequency 2x / week   1-2   PT Duration 6 weeks    PT Treatment/Interventions ADLs/Self Care Home Management;Cryotherapy;Electrical Stimulation;Iontophoresis 4mg /ml Dexamethasone;Moist Heat;Traction;Ultrasound;Therapeutic exercise;Neuromuscular re-education;Manual techniques;Passive range of motion;Dry needling;Taping;Spinal Manipulations;Joint Manipulations    PT Next Visit Plan needs C-T spine mobility and stretching, consider manual or modalaties    PT Home Exercise Plan Access Code: [email or web address]    Consulted and Agree with Plan of Care Patient           Patient will benefit from skilled therapeutic intervention in order to improve the following deficits and impairments:  Decreased activity tolerance, Decreased mobility, Decreased range of motion, Decreased strength, Hypomobility, Impaired flexibility, Increased muscle spasms, Increased fascial restricitons, Pain  Visit Diagnosis: Cervicalgia  Muscle weakness (generalized)  Other abnormalities of gait and mobility     Problem List Patient Active Problem List   Diagnosis Date Noted  . Status post total replacement of left hip 02/10/2019  . H/O total hip arthroplasty 02/10/2019  . Unilateral primary osteoarthritis, left hip 02/09/2019  . Trochanteric bursitis, left hip 04/19/2017  . It band syndrome, left 04/19/2017  . Leukocytopenia 03/08/2013  . Macular  degeneration (senile) of retina 11/23/2012  . Anemia 11/23/2012  . Anxiety state 08/13/2012  . 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 Disorder of muscle, ligament, and fascia 08/13/2012  . Enthesopathy of hip region 08/13/2012  . Degenerative arthritis of hip 07/29/2012  . Melanoma of skin ([place]) 05/07/2009  . Meningitis 05/07/2009    Aaron Edelman  R Burdette Forehand,PT,DPT 01/26/2020, 8:39 AM  [facility] Physical Therapy [address] [place], [place], [address] Phone: [phone]   Fax:  [phone]  Name: Palak Tercero MRN: [id number] Date of Birth: May 03, 1950

## 2020-01-31 ENCOUNTER — Ambulatory Visit (INDEPENDENT_AMBULATORY_CARE_PROVIDER_SITE_OTHER): Payer: BC Managed Care – PPO | Admitting: Physical Therapy

## 2020-01-31 ENCOUNTER — Other Ambulatory Visit: Payer: Self-pay

## 2020-01-31 DIAGNOSIS — M542 Cervicalgia: Secondary | ICD-10-CM

## 2020-01-31 DIAGNOSIS — R2681 Unsteadiness on feet: Secondary | ICD-10-CM

## 2020-01-31 DIAGNOSIS — M6281 Muscle weakness (generalized): Secondary | ICD-10-CM | POA: Diagnosis not present

## 2020-01-31 DIAGNOSIS — R2689 Other abnormalities of gait and mobility: Secondary | ICD-10-CM

## 2020-01-31 NOTE — Therapy (Signed)
[facility] Physical Therapy [address] [place], [place], [address] Phone: [phone]   Fax:  [phone]  Physical Therapy Treatment  Patient Details  Name: Autumn Lindsey MRN: [id number] Date of Birth: Jan 11, 1951 Referring Provider (PT): Carney Bern   Encounter Date: 01/31/2020   PT End of Session - 01/31/20 1433    Visit Number 3    Number of Visits 12    Date for PT Re-Evaluation 03/01/20    PT Start Time 3785    PT Stop Time 1437    PT Time Calculation (min) 45 min    Activity Tolerance Patient tolerated treatment well    Behavior During Therapy [facility] for tasks assessed/performed           Past Medical History:  Diagnosis Date  . Anemia   . Arthritis   . Depression   . GERD (gastroesophageal reflux disease)   . Headache(784.0)    in past  . History of melanoma excision   . PONV (postoperative nausea and vomiting)   . Unilateral primary osteoarthritis, left hip 02/09/2019    Past Surgical History:  Procedure Laterality Date  . APPENDECTOMY  1970's  . BREAST ENHANCEMENT SURGERY    . SKIN GRAFT  1980's  . TONSILLECTOMY    . TOTAL HIP ARTHROPLASTY Right 07/29/2012   Procedure: RIGHT TOTAL HIP ARTHROPLASTY ANTERIOR APPROACH;  Surgeon: Mcarthur Rossetti, MD;  Location: [facility];  Service: Orthopedics;  Laterality: Right;  . TOTAL HIP ARTHROPLASTY Left 02/10/2019   Procedure: LEFT TOTAL HIP ARTHROPLASTY ANTERIOR APPROACH;  Surgeon: Mcarthur Rossetti, MD;  Location: [facility];  Service: Orthopedics;  Laterality: Left;    There were no vitals filed for this visit.   Subjective Assessment - 01/31/20 1432    Subjective she relays her neck pain is overall about 3/10. Relays not as much compliance with HEP as she should be    Diagnostic tests neck XR "Degenerative changes throughout the mid cervical spine with endplate spurring and diminished disc space.  No acute fractures.  Loss of lordotic curvature"    Patient Stated Goals reduce pain      Pain Onset More than a month ago           [facility] Adult PT Treatment/Exercise - 01/31/20 0001      Neck Exercises: Machines for Strengthening   UBE (Upper Arm Bike) L3, 2.5 min fwd, 2.5 min retro      Neck Exercises: Theraband   Shoulder Extension Red    Shoulder Extension Limitations 2 sets of 15    Rows Red    Rows Limitations 2 sets of 15    Other Theraband Exercises bilat ER with scap retraction 2 sets of 10 red      Neck Exercises: Seated   Neck Retraction 15 reps    Other Seated Exercise seated thoracic extensions X 15 holding 5 sec over 1/2 roll      Moist Heat Therapy   Number Minutes Moist Heat 10 Minutes    Moist Heat Location Cervical      Electrical Stimulation   Electrical Stimulation Location neck and left upper trap    Electrical Stimulation Action IFC    Electrical Stimulation Parameters tolerance 10 min with heat    Electrical Stimulation Goals Pain      Manual Therapy   Manual therapy comments STM, cuppin, Trigger point release to Lt upper trap, STM/IASTM      Neck Exercises: Stretches   Upper Trapezius Stretch Left;30 seconds;3 reps  Levator Stretch Left;3 reps;30 seconds                       PT Long Term Goals - 01/19/20 0914      PT LONG TERM GOAL #1   Title Pt to be independent with final HEP    Time 6    Period Weeks    Status New    Target Date 03/01/20      PT LONG TERM GOAL #2   Title Pt will improve neck ROM to [facility].    Time 6    Period Weeks    Status New      PT LONG TERM GOAL #3   Title Pt will report improved sleep quality due to less neck pain/discomfort    Time 6    Period Weeks    Status New      PT LONG TERM GOAL #4   Title Pt to report decreased neck pain to less than 2-3 overall with activity, work, neck ROM    Time 6    Period Weeks    Status New      PT LONG TERM GOAL #5   Title FOTO functional outcome score will improve to 63% indicating improved function with less pain.    Baseline 53     Time 6    Period Weeks    Status New                 Plan - 01/31/20 1434    Clinical Impression Statement Overall neck ROM and tightness appears to be improving some. Pain has overall came down to about 3/10 overall. Used cupping therapy today in efforts to reduce muscular pain and triggerpoints in Lt upper trap and cervical paraspinals. Continue POC    Examination-Activity Limitations Carry;Lift;Reach Overhead    Examination-Participation Restrictions Driving;Occupation    Stability/Clinical Decision Making Stable/Uncomplicated    Rehab Potential Good    PT Frequency 2x / week   1-2   PT Duration 6 weeks    PT Treatment/Interventions ADLs/Self Care Home Management;Cryotherapy;Electrical Stimulation;Iontophoresis 4mg /ml Dexamethasone;Moist Heat;Traction;Ultrasound;Therapeutic exercise;Neuromuscular re-education;Manual techniques;Passive range of motion;Dry needling;Taping;Spinal Manipulations;Joint Manipulations    PT Next Visit Plan needs C-T spine mobility and stretching, consider manual or modalaties    PT Home Exercise Plan Access Code: [email or web address]    Consulted and Agree with Plan of Care Patient           Patient will benefit from skilled therapeutic intervention in order to improve the following deficits and impairments:  Decreased activity tolerance, Decreased mobility, Decreased range of motion, Decreased strength, Hypomobility, Impaired flexibility, Increased muscle spasms, Increased fascial restricitons, Pain  Visit Diagnosis: Cervicalgia  Muscle weakness (generalized)  Other abnormalities of gait and mobility  Unsteadiness on feet  Muscle weakness     Problem List Patient Active Problem List   Diagnosis Date Noted  . Status post total replacement of left hip 02/10/2019  . H/O total hip arthroplasty 02/10/2019  . Unilateral primary osteoarthritis, left hip 02/09/2019  . Trochanteric bursitis, left hip 04/19/2017  . It band syndrome, left 04/19/2017  .  Leukocytopenia 03/08/2013  . Macular degeneration (senile) of retina 11/23/2012  . Anemia 11/23/2012  . Anxiety state 08/13/2012  . 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 Disorder of muscle, ligament, and fascia 08/13/2012  . Enthesopathy of hip region 08/13/2012  . Degenerative arthritis of hip 07/29/2012  . Melanoma of skin ([place]) 05/07/2009  . Meningitis 05/07/2009    Marrianne Mood  Arlow Spiers,PT,DPT 01/31/2020, 2:35 PM  [facility] Physical Therapy [address] [place], [place], [address] Phone: [phone]   Fax:  [phone]  Name: Anecia Nusbaum MRN: [id number] Date of Birth: 05/30/1950

## 2020-02-06 ENCOUNTER — Encounter: Payer: Self-pay | Admitting: Physical Therapy

## 2020-02-06 ENCOUNTER — Other Ambulatory Visit: Payer: Self-pay

## 2020-02-06 ENCOUNTER — Ambulatory Visit: Payer: BC Managed Care – PPO | Admitting: Physical Therapy

## 2020-02-06 DIAGNOSIS — M6281 Muscle weakness (generalized): Secondary | ICD-10-CM

## 2020-02-06 DIAGNOSIS — M542 Cervicalgia: Secondary | ICD-10-CM

## 2020-02-06 NOTE — Therapy (Signed)
[facility] Physical Therapy [address] [place], [place], [address] Phone: [phone]   Fax:  [phone]  Physical Therapy Treatment  Patient Details  Name: Autumn Lindsey MRN: [id number] Date of Birth: 05-27-1950 Referring Provider (PT): Carney Bern   Encounter Date: 02/06/2020   PT End of Session - 02/06/20 1557    Visit Number 4    Number of Visits 12    Date for PT Re-Evaluation 03/01/20    PT Start Time 1779    PT Stop Time 1401    PT Time Calculation (min) 45 min    Activity Tolerance Patient tolerated treatment well    Behavior During Therapy [facility] for tasks assessed/performed           Past Medical History:  Diagnosis Date  . Anemia   . Arthritis   . Depression   . GERD (gastroesophageal reflux disease)   . Headache(784.0)    in past  . History of melanoma excision   . PONV (postoperative nausea and vomiting)   . Unilateral primary osteoarthritis, left hip 02/09/2019    Past Surgical History:  Procedure Laterality Date  . APPENDECTOMY  1970's  . BREAST ENHANCEMENT SURGERY    . SKIN GRAFT  1980's  . TONSILLECTOMY    . TOTAL HIP ARTHROPLASTY Right 07/29/2012   Procedure: RIGHT TOTAL HIP ARTHROPLASTY ANTERIOR APPROACH;  Surgeon: Mcarthur Rossetti, MD;  Location: [facility];  Service: Orthopedics;  Laterality: Right;  . TOTAL HIP ARTHROPLASTY Left 02/10/2019   Procedure: LEFT TOTAL HIP ARTHROPLASTY ANTERIOR APPROACH;  Surgeon: Mcarthur Rossetti, MD;  Location: [facility];  Service: Orthopedics;  Laterality: Left;    There were no vitals filed for this visit.   Subjective Assessment - 02/06/20 1554    Subjective relays some improvemets and overall is about 2/10 neck/Lt shoulder pain today    Diagnostic tests neck XR "Degenerative changes throughout the mid cervical spine with endplate spurring and diminished disc space.  No acute fractures.  Loss of lordotic curvature"    Patient Stated Goals reduce pain    Pain Onset More than a  month ago                             [facility] Adult PT Treatment/Exercise - 02/06/20 0001      Neck Exercises: Machines for Strengthening   UBE (Upper Arm Bike) L3, 2.5 min fwd, 2.5 min retro      Neck Exercises: Theraband   Shoulder Extension Green;20 reps    Rows Green;20 reps    Other Theraband Exercises bilat ER with scap retraction 2 sets of 10 red band, H abd 2 sets of 10 red band      Neck Exercises: Seated   Neck Retraction 15 reps    Other Seated Exercise seated thoracic extensions X 15 holding 5 sec over 1/2 roll      Moist Heat Therapy   Number Minutes Moist Heat 10 Minutes    Moist Heat Location Cervical      Electrical Stimulation   Electrical Stimulation Location neck and left upper trap    Electrical Stimulation Action IFC    Electrical Stimulation Parameters tolerance 10 min    Electrical Stimulation Goals Pain      Manual Therapy   Manual therapy comments STM, cuppin, Trigger point release to Lt upper trap, STM/IASTM  PT Long Term Goals - 01/19/20 0914      PT LONG TERM GOAL #1   Title Pt to be independent with final HEP    Time 6    Period Weeks    Status New    Target Date 03/01/20      PT LONG TERM GOAL #2   Title Pt will improve neck ROM to [facility].    Time 6    Period Weeks    Status New      PT LONG TERM GOAL #3   Title Pt will report improved sleep quality due to less neck pain/discomfort    Time 6    Period Weeks    Status New      PT LONG TERM GOAL #4   Title Pt to report decreased neck pain to less than 2-3 overall with activity, work, neck ROM    Time 6    Period Weeks    Status New      PT LONG TERM GOAL #5   Title FOTO functional outcome score will improve to 63% indicating improved function with less pain.    Baseline 53    Time 6    Period Weeks    Status New                 Plan - 02/06/20 1558    Clinical Impression Statement Pain down from 3 last week to 2 this  week. Continued with neck/shoulder strength, manual and modalaties to improve ovearll symptoms and tigger point. Continue POC    Examination-Activity Limitations Carry;Lift;Reach Overhead    Examination-Participation Restrictions Driving;Occupation    Stability/Clinical Decision Making Stable/Uncomplicated    Rehab Potential Good    PT Frequency 2x / week   1-2   PT Duration 6 weeks    PT Treatment/Interventions ADLs/Self Care Home Management;Cryotherapy;Electrical Stimulation;Iontophoresis 4mg /ml Dexamethasone;Moist Heat;Traction;Ultrasound;Therapeutic exercise;Neuromuscular re-education;Manual techniques;Passive range of motion;Dry needling;Taping;Spinal Manipulations;Joint Manipulations    PT Next Visit Plan needs C-T spine mobility and stretching, consider manual or modalaties    PT Home Exercise Plan Access Code: [email or web address]    Consulted and Agree with Plan of Care Patient           Patient will benefit from skilled therapeutic intervention in order to improve the following deficits and impairments:  Decreased activity tolerance, Decreased mobility, Decreased range of motion, Decreased strength, Hypomobility, Impaired flexibility, Increased muscle spasms, Increased fascial restricitons, Pain  Visit Diagnosis: Cervicalgia  Muscle weakness (generalized)     Problem List Patient Active Problem List   Diagnosis Date Noted  . Status post total replacement of left hip 02/10/2019  . H/O total hip arthroplasty 02/10/2019  . Unilateral primary osteoarthritis, left hip 02/09/2019  . Trochanteric bursitis, left hip 04/19/2017  . It band syndrome, left 04/19/2017  . Leukocytopenia 03/08/2013  . Macular degeneration (senile) of retina 11/23/2012  . Anemia 11/23/2012  . Anxiety state 08/13/2012  . 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 Disorder of muscle, ligament, and fascia 08/13/2012  . Enthesopathy of hip region 08/13/2012  . Degenerative arthritis of hip 07/29/2012  . Melanoma of  skin ([place]) 05/07/2009  . Meningitis 05/07/2009    Silvestre Mesi 02/06/2020, 4:00 PM  [facility] Physical Therapy [address] [place], [place], [address] Phone: [phone]   Fax:  [phone]  Name: Autumn Lindsey MRN: [id number] Date of Birth: 21-Apr-1950

## 2020-02-09 ENCOUNTER — Encounter: Payer: Self-pay | Admitting: Physical Therapy

## 2020-02-09 ENCOUNTER — Ambulatory Visit: Payer: BC Managed Care – PPO | Admitting: Physical Therapy

## 2020-02-09 ENCOUNTER — Other Ambulatory Visit: Payer: Self-pay

## 2020-02-09 DIAGNOSIS — M542 Cervicalgia: Secondary | ICD-10-CM | POA: Diagnosis not present

## 2020-02-09 DIAGNOSIS — R2689 Other abnormalities of gait and mobility: Secondary | ICD-10-CM | POA: Diagnosis not present

## 2020-02-09 DIAGNOSIS — M6281 Muscle weakness (generalized): Secondary | ICD-10-CM | POA: Diagnosis not present

## 2020-02-09 NOTE — Therapy (Signed)
[facility] [address] [place], [place], [address] Phone: [phone]   Fax:  [phone]  Physical Therapy Treatment  Patient Details  Name: Autumn Lindsey MRN: [id number] Date of Birth: 1950-10-16 Referring Provider (PT): Carney Bern   Encounter Date: 02/09/2020   PT End of Session - 02/09/20 0838    Visit Number 5    Number of Visits 12    Date for PT Re-Evaluation 03/01/20    PT Start Time 0801    PT Stop Time 0845    PT Time Calculation (min) 44 min    Activity Tolerance Patient tolerated treatment well    Behavior During Therapy [facility] for tasks assessed/performed           Past Medical History:  Diagnosis Date  . Anemia   . Arthritis   . Depression   . GERD (gastroesophageal reflux disease)   . Headache(784.0)    in past  . History of melanoma excision   . PONV (postoperative nausea and vomiting)   . Unilateral primary osteoarthritis, left hip 02/09/2019    Past Surgical History:  Procedure Laterality Date  . APPENDECTOMY  1970's  . BREAST ENHANCEMENT SURGERY    . SKIN GRAFT  1980's  . TONSILLECTOMY    . TOTAL HIP ARTHROPLASTY Right 07/29/2012   Procedure: RIGHT TOTAL HIP ARTHROPLASTY ANTERIOR APPROACH;  Surgeon: Mcarthur Rossetti, MD;  Location: [facility];  Service: Orthopedics;  Laterality: Right;  . TOTAL HIP ARTHROPLASTY Left 02/10/2019   Procedure: LEFT TOTAL HIP ARTHROPLASTY ANTERIOR APPROACH;  Surgeon: Mcarthur Rossetti, MD;  Location: [facility];  Service: Orthopedics;  Laterality: Left;    There were no vitals filed for this visit.   Subjective Assessment - 02/09/20 0815    Subjective relays about 3/10 neck/Lt shoulder pain upon arrival but she noticed she was able to now turn her head without having pain.    Diagnostic tests neck XR "Degenerative changes throughout the mid cervical spine with endplate spurring and diminished disc space.  No acute fractures.  Loss of lordotic curvature"    Patient Stated  Goals reduce pain    Pain Onset More than a month ago            [facility] Adult PT Treatment/Exercise - 02/09/20 0001      Neck Exercises: Machines for Strengthening   UBE (Upper Arm Bike) L4, 2.5 min fwd, 2.5 min retro      Neck Exercises: Theraband   Shoulder Extension Green;20 reps    Rows Green;20 reps    Other Theraband Exercises bilat ER with scap retraction 2 sets of 10 red band, H abd 2 sets of 10 red band    Other Theraband Exercises Lat pulldown Green 2X15 seated      Neck Exercises: Seated   Neck Retraction Limitations 2X10    Other Seated Exercise seated thoracic extensions X 15 holding 5 sec over 1/2 roll      Moist Heat Therapy   Number Minutes Moist Heat 10 Minutes    Moist Heat Location Cervical      Electrical Stimulation   Electrical Stimulation Location neck and left upper trap    Electrical Stimulation Action IFC    Electrical Stimulation Parameters tolerance 10 min    Electrical Stimulation Goals Pain      Manual Therapy   Manual therapy comments STM, cuppin, Trigger point release to Lt upper trap, STM/IASTM  PT Long Term Goals - 02/09/20 0840      PT LONG TERM GOAL #1   Title Pt to be independent with final HEP    Baseline some compliance    Time 6    Period Weeks    Status On-going      PT LONG TERM GOAL #2   Title Pt will improve neck ROM to [facility].    Baseline improving    Time 6    Period Weeks    Status On-going      PT LONG TERM GOAL #3   Title Pt will report improved sleep quality due to less neck pain/discomfort    Baseline still bothers her with sleeping    Time 6    Period Weeks    Status On-going      PT LONG TERM GOAL #4   Title Pt to report decreased neck pain to less than 2-3 overall with activity, work, neck ROM    Baseline has been at 2-3 over last week    Time 6    Period Weeks    Status Achieved      PT LONG TERM GOAL #5   Title FOTO functional outcome score will improve to 63%  indicating improved function with less pain.    Baseline 53    Time 6    Period Weeks    Status On-going                 Plan - 02/09/20 0839    Clinical Impression Statement she can now rotate her neck without pain and seems to be improving well with PT. Continue POC    Examination-Activity Limitations Carry;Lift;Reach Overhead    Examination-Participation Restrictions Driving;Occupation    Stability/Clinical Decision Making Stable/Uncomplicated    Rehab Potential Good    PT Frequency 2x / week   1-2   PT Duration 6 weeks    PT Treatment/Interventions ADLs/Self Care Home Management;Cryotherapy;Electrical Stimulation;Iontophoresis 4mg /ml Dexamethasone;Moist Heat;Traction;Ultrasound;Therapeutic exercise;Neuromuscular re-education;Manual techniques;Passive range of motion;Dry needling;Taping;Spinal Manipulations;Joint Manipulations    PT Next Visit Plan needs C-T spine mobility and stretching, consider manual or modalaties    PT Home Exercise Plan Access Code: [email or web address]    Consulted and Agree with Plan of Care Patient           Patient will benefit from skilled therapeutic intervention in order to improve the following deficits and impairments:  Decreased activity tolerance, Decreased mobility, Decreased range of motion, Decreased strength, Hypomobility, Impaired flexibility, Increased muscle spasms, Increased fascial restricitons, Pain  Visit Diagnosis: Cervicalgia  Muscle weakness (generalized)  Other abnormalities of gait and mobility     Problem List Patient Active Problem List   Diagnosis Date Noted  . Status post total replacement of left hip 02/10/2019  . H/O total hip arthroplasty 02/10/2019  . Unilateral primary osteoarthritis, left hip 02/09/2019  . Trochanteric bursitis, left hip 04/19/2017  . It band syndrome, left 04/19/2017  . Leukocytopenia 03/08/2013  . Macular degeneration (senile) of retina 11/23/2012  . Anemia 11/23/2012  . Anxiety state  08/13/2012  . 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 Disorder of muscle, ligament, and fascia 08/13/2012  . Enthesopathy of hip region 08/13/2012  . Degenerative arthritis of hip 07/29/2012  . Melanoma of skin ([place]) 05/07/2009  . Meningitis 05/07/2009    Silvestre Mesi 02/09/2020, 8:41 AM  [facility] Therapy [address] [place], [place], [address] Phone: [phone]   Fax:  [phone]  Name: Autumn Lindsey MRN: [id number] Date  of Birth: December 05, 1950

## 2020-02-15 ENCOUNTER — Other Ambulatory Visit: Payer: Self-pay

## 2020-02-15 ENCOUNTER — Encounter: Payer: BC Managed Care – PPO | Admitting: Physical Therapy

## 2020-02-15 ENCOUNTER — Ambulatory Visit: Payer: BC Managed Care – PPO | Admitting: Physical Therapy

## 2020-02-15 DIAGNOSIS — M542 Cervicalgia: Secondary | ICD-10-CM | POA: Diagnosis not present

## 2020-02-15 DIAGNOSIS — M6281 Muscle weakness (generalized): Secondary | ICD-10-CM | POA: Diagnosis not present

## 2020-02-15 NOTE — Therapy (Signed)
[facility] [address] [place], [place], [address] Phone: [phone]   Fax:  [phone]  Physical Therapy Treatment  Patient Details  Name: Autumn Lindsey MRN: [id number] Date of Birth: 19-Dec-1950 Referring Provider (PT): Carney Bern   Encounter Date: 02/15/2020   PT End of Session - 02/15/20 1144    Visit Number 6    Number of Visits 12    Date for PT Re-Evaluation 03/01/20    PT Start Time 1103    PT Stop Time 1150    PT Time Calculation (min) 47 min    Activity Tolerance Patient tolerated treatment well    Behavior During Therapy [facility] for tasks assessed/performed           Past Medical History:  Diagnosis Date  . Anemia   . Arthritis   . Depression   . GERD (gastroesophageal reflux disease)   . Headache(784.0)    in past  . History of melanoma excision   . PONV (postoperative nausea and vomiting)   . Unilateral primary osteoarthritis, left hip 02/09/2019    Past Surgical History:  Procedure Laterality Date  . APPENDECTOMY  1970's  . BREAST ENHANCEMENT SURGERY    . SKIN GRAFT  1980's  . TONSILLECTOMY    . TOTAL HIP ARTHROPLASTY Right 07/29/2012   Procedure: RIGHT TOTAL HIP ARTHROPLASTY ANTERIOR APPROACH;  Surgeon: Mcarthur Rossetti, MD;  Location: [facility];  Service: Orthopedics;  Laterality: Right;  . TOTAL HIP ARTHROPLASTY Left 02/10/2019   Procedure: LEFT TOTAL HIP ARTHROPLASTY ANTERIOR APPROACH;  Surgeon: Mcarthur Rossetti, MD;  Location: [facility];  Service: Orthopedics;  Laterality: Left;    There were no vitals filed for this visit.   Subjective Assessment - 02/15/20 1125    Subjective relays the pain is getting better overall in her neck    Diagnostic tests neck XR "Degenerative changes throughout the mid cervical spine with endplate spurring and diminished disc space.  No acute fractures.  Loss of lordotic curvature"    Patient Stated Goals reduce pain    Pain Onset More than a month ago               [facility] PT Assessment - 02/15/20 0001      AROM   Cervical Flexion 40    Cervical Extension 40    Cervical - Right Side Bend 25    Cervical - Left Side Bend 20    Cervical - Right Rotation 60    Cervical - Left Rotation 50      Strength   Overall Strength Comments UE strength overall 5/5           [facility] Adult PT Treatment/Exercise - 02/15/20 0001      Neck Exercises: Theraband   Shoulder Extension Green    Shoulder Extension Limitations 2 sets of 15    Rows Green    Rows Limitations 2 sets of 15    Other Theraband Exercises bilat ER with scap retraction 2 sets of 15 red band      Neck Exercises: Seated   Neck Retraction Limitations 2X10    Other Seated Exercise seated thoracic extensions X 15 holding 5 sec over 1/2 roll    Other Seated Exercise seated neck rotation MWM with towel 10 reps holding 5 sec bilat      Moist Heat Therapy   Number Minutes Moist Heat 10 Minutes    Moist Heat Location Cervical      Electrical Stimulation   Electrical  Stimulation Location neck and left upper trap    Electrical Stimulation Action IFC    Electrical Stimulation Parameters tolerance 10 min    Electrical Stimulation Goals Pain      Manual Therapy   Manual therapy comments STM, cuppin, Trigger point release to Lt upper trap, STM/IASTM      Neck Exercises: Stretches   Upper Trapezius Stretch Right;Left;2 reps;30 seconds    Levator Stretch Right;Left;2 reps;30 seconds                       PT Long Term Goals - 02/09/20 0840      PT LONG TERM GOAL #1   Title Pt to be independent with final HEP    Baseline some compliance    Time 6    Period Weeks    Status On-going      PT LONG TERM GOAL #2   Title Pt will improve neck ROM to [facility].    Baseline improving    Time 6    Period Weeks    Status On-going      PT LONG TERM GOAL #3   Title Pt will report improved sleep quality due to less neck pain/discomfort    Baseline still bothers her with sleeping     Time 6    Period Weeks    Status On-going      PT LONG TERM GOAL #4   Title Pt to report decreased neck pain to less than 2-3 overall with activity, work, neck ROM    Baseline has been at 2-3 over last week    Time 6    Period Weeks    Status Achieved      PT LONG TERM GOAL #5   Title FOTO functional outcome score will improve to 63% indicating improved function with less pain.    Baseline 53    Time 6    Period Weeks    Status On-going                 Plan - 02/15/20 1145    Clinical Impression Statement updated measurements show improvments in neck ROM. She is also having overall less pain and appears to be progressing well with PT    Examination-Activity Limitations Carry;Lift;Reach Overhead    Examination-Participation Restrictions Driving;Occupation    Stability/Clinical Decision Making Stable/Uncomplicated    Rehab Potential Good    PT Frequency 2x / week   1-2   PT Duration 6 weeks    PT Treatment/Interventions ADLs/Self Care Home Management;Cryotherapy;Electrical Stimulation;Iontophoresis 4mg /ml Dexamethasone;Moist Heat;Traction;Ultrasound;Therapeutic exercise;Neuromuscular re-education;Manual techniques;Passive range of motion;Dry needling;Taping;Spinal Manipulations;Joint Manipulations    PT Next Visit Plan needs C-T spine mobility and stretching, consider manual or modalaties    PT Home Exercise Plan Access Code: [email or web address]    Consulted and Agree with Plan of Care Patient           Patient will benefit from skilled therapeutic intervention in order to improve the following deficits and impairments:  Decreased activity tolerance,Decreased mobility,Decreased range of motion,Decreased strength,Hypomobility,Impaired flexibility,Increased muscle spasms,Increased fascial restricitons,Pain  Visit Diagnosis: Cervicalgia  Muscle weakness (generalized)     Problem List Patient Active Problem List   Diagnosis Date Noted  . Status post total replacement of left  hip 02/10/2019  . H/O total hip arthroplasty 02/10/2019  . Unilateral primary osteoarthritis, left hip 02/09/2019  . Trochanteric bursitis, left hip 04/19/2017  . It band syndrome, left 04/19/2017  . Leukocytopenia 03/08/2013  . Macular degeneration (senile)  of retina 11/23/2012  . Anemia 11/23/2012  . Anxiety state 08/13/2012  . 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 Disorder of muscle, ligament, and fascia 08/13/2012  . Enthesopathy of hip region 08/13/2012  . Degenerative arthritis of hip 07/29/2012  . Melanoma of skin ([place]) 05/07/2009  . Meningitis 05/07/2009    Silvestre Mesi 02/15/2020, 11:45 AM  [facility] Physical Therapy [address] [place], [place], [address] Phone: [phone]   Fax:  [phone]  Name: Autumn Lindsey MRN: [id number] Date of Birth: 1950-08-01

## 2020-02-21 ENCOUNTER — Other Ambulatory Visit: Payer: Self-pay

## 2020-02-21 ENCOUNTER — Ambulatory Visit: Payer: BC Managed Care – PPO | Admitting: Physical Therapy

## 2020-02-21 DIAGNOSIS — R2689 Other abnormalities of gait and mobility: Secondary | ICD-10-CM

## 2020-02-21 DIAGNOSIS — M542 Cervicalgia: Secondary | ICD-10-CM

## 2020-02-21 DIAGNOSIS — M6281 Muscle weakness (generalized): Secondary | ICD-10-CM | POA: Diagnosis not present

## 2020-02-21 NOTE — Therapy (Signed)
[facility] Physical Therapy [address] [place], [place], [address] Phone: [phone]   Fax:  [phone]  Physical Therapy Treatment  Patient Details  Name: Autumn Lindsey MRN: [id number] Date of Birth: 08/21/1950 Referring Provider (PT): Carney Bern   Encounter Date: 02/21/2020   PT End of Session - 02/21/20 0836    Visit Number 7    Number of Visits 12    Date for PT Re-Evaluation 03/01/20    PT Start Time 0803    PT Stop Time 0855    PT Time Calculation (min) 52 min    Activity Tolerance Patient tolerated treatment well    Behavior During Therapy [facility] for tasks assessed/performed           Past Medical History:  Diagnosis Date  . Anemia   . Arthritis   . Depression   . GERD (gastroesophageal reflux disease)   . Headache(784.0)    in past  . History of melanoma excision   . PONV (postoperative nausea and vomiting)   . Unilateral primary osteoarthritis, left hip 02/09/2019    Past Surgical History:  Procedure Laterality Date  . APPENDECTOMY  1970's  . BREAST ENHANCEMENT SURGERY    . SKIN GRAFT  1980's  . TONSILLECTOMY    . TOTAL HIP ARTHROPLASTY Right 07/29/2012   Procedure: RIGHT TOTAL HIP ARTHROPLASTY ANTERIOR APPROACH;  Surgeon: Mcarthur Rossetti, MD;  Location: [facility];  Service: Orthopedics;  Laterality: Right;  . TOTAL HIP ARTHROPLASTY Left 02/10/2019   Procedure: LEFT TOTAL HIP ARTHROPLASTY ANTERIOR APPROACH;  Surgeon: Mcarthur Rossetti, MD;  Location: [facility];  Service: Orthopedics;  Laterality: Left;    There were no vitals filed for this visit.   Subjective Assessment - 02/21/20 0825    Subjective relays her neck pain is a little worse this week, it bothered her some during walking and she may have slept wrong relays 3/10 overall pain    Diagnostic tests neck XR "Degenerative changes throughout the mid cervical spine with endplate spurring and diminished disc space.  No acute fractures.  Loss of lordotic curvature"     Patient Stated Goals reduce pain    Pain Onset More than a month ago             [facility] Adult PT Treatment/Exercise - 02/21/20 0001      Neck Exercises: Theraband   Shoulder Extension Green    Shoulder Extension Limitations 2 sets of 15    Rows Green    Rows Limitations 2 sets of 15    Horizontal ABduction Red    Horizontal ABduction Limitations 2X15    Other Theraband Exercises bilat ER with scap retraction 2 sets of 15 red band      Neck Exercises: Seated   Neck Retraction Limitations 2X10    Other Seated Exercise seated thoracic extensions X 15 holding 5 sec over 1/2 roll    Other Seated Exercise seated neck rotation MWM with towel 10 reps holding 5 sec bilat      Moist Heat Therapy   Number Minutes Moist Heat 10 Minutes    Moist Heat Location Cervical      Electrical Stimulation   Electrical Stimulation Location neck and left upper trap    Electrical Stimulation Action IFC    Electrical Stimulation Parameters tolerance, 10 min    Electrical Stimulation Goals Pain      Manual Therapy   Manual therapy comments STM, cuppin, Trigger point release to Lt upper trap, STM/IASTM  Neck Exercises: Stretches   Upper Trapezius Stretch Right;Left;2 reps;30 seconds    Levator Stretch Right;Left;2 reps;30 seconds                       PT Long Term Goals - 02/09/20 0840      PT LONG TERM GOAL #1   Title Pt to be independent with final HEP    Baseline some compliance    Time 6    Period Weeks    Status On-going      PT LONG TERM GOAL #2   Title Pt will improve neck ROM to [facility].    Baseline improving    Time 6    Period Weeks    Status On-going      PT LONG TERM GOAL #3   Title Pt will report improved sleep quality due to less neck pain/discomfort    Baseline still bothers her with sleeping    Time 6    Period Weeks    Status On-going      PT LONG TERM GOAL #4   Title Pt to report decreased neck pain to less than 2-3 overall with activity, work,  neck ROM    Baseline has been at 2-3 over last week    Time 6    Period Weeks    Status Achieved      PT LONG TERM GOAL #5   Title FOTO functional outcome score will improve to 63% indicating improved function with less pain.    Baseline 53    Time 6    Period Weeks    Status On-going                 Plan - 02/21/20 0848    Clinical Impression Statement She had more pain over the last few days but admits she has not done many of her exercises. She does appear to get relief with exercise, stretching, manual therapy and modalties. PT will continue to progress her as able.    Examination-Activity Limitations Carry;Lift;Reach Overhead    Examination-Participation Restrictions Driving;Occupation    Stability/Clinical Decision Making Stable/Uncomplicated    Rehab Potential Good    PT Frequency 2x / week   1-2   PT Duration 6 weeks    PT Treatment/Interventions ADLs/Self Care Home Management;Cryotherapy;Electrical Stimulation;Iontophoresis 4mg /ml Dexamethasone;Moist Heat;Traction;Ultrasound;Therapeutic exercise;Neuromuscular re-education;Manual techniques;Passive range of motion;Dry needling;Taping;Spinal Manipulations;Joint Manipulations    PT Next Visit Plan needs C-T spine mobility and stretching, consider manual or modalaties    PT Home Exercise Plan Access Code: [email or web address]    Consulted and Agree with Plan of Care Patient           Patient will benefit from skilled therapeutic intervention in order to improve the following deficits and impairments:  Decreased activity tolerance,Decreased mobility,Decreased range of motion,Decreased strength,Hypomobility,Impaired flexibility,Increased muscle spasms,Increased fascial restricitons,Pain  Visit Diagnosis: Cervicalgia  Muscle weakness (generalized)  Other abnormalities of gait and mobility     Problem List Patient Active Problem List   Diagnosis Date Noted  . Status post total replacement of left hip 02/10/2019  . H/O  total hip arthroplasty 02/10/2019  . Unilateral primary osteoarthritis, left hip 02/09/2019  . Trochanteric bursitis, left hip 04/19/2017  . It band syndrome, left 04/19/2017  . Leukocytopenia 03/08/2013  . Macular degeneration (senile) of retina 11/23/2012  . Anemia 11/23/2012  . Anxiety state 08/13/2012  . 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 Disorder of muscle, ligament, and fascia 08/13/2012  . Enthesopathy of hip region  08/13/2012  . Degenerative arthritis of hip 07/29/2012  . Melanoma of skin ([place]) 05/07/2009  . Meningitis 05/07/2009    Silvestre Mesi 02/21/2020, 9:35 AM  [facility] Physical Therapy [address] [place], [place], [address] Phone: [phone]   Fax:  [phone]  Name: Zykera Abella MRN: [id number] Date of Birth: June 11, 1950

## 2020-02-28 ENCOUNTER — Ambulatory Visit (INDEPENDENT_AMBULATORY_CARE_PROVIDER_SITE_OTHER): Payer: BC Managed Care – PPO | Admitting: Rehabilitative and Restorative Service Providers"

## 2020-02-28 ENCOUNTER — Encounter: Payer: Self-pay | Admitting: Rehabilitative and Restorative Service Providers"

## 2020-02-28 ENCOUNTER — Other Ambulatory Visit: Payer: Self-pay

## 2020-02-28 DIAGNOSIS — R2689 Other abnormalities of gait and mobility: Secondary | ICD-10-CM

## 2020-02-28 DIAGNOSIS — M6281 Muscle weakness (generalized): Secondary | ICD-10-CM | POA: Diagnosis not present

## 2020-02-28 DIAGNOSIS — M542 Cervicalgia: Secondary | ICD-10-CM | POA: Diagnosis not present

## 2020-02-28 NOTE — Therapy (Signed)
[facility] [address] [place], [place], [address] Phone: [phone]   Fax:  [phone]  Physical Therapy Treatment  Patient Details  Name: Autumn Lindsey MRN: [id number] Date of Birth: 03-07-1951 Referring Provider (PT): Carney Bern   Encounter Date: 02/28/2020   PT End of Session - 02/28/20 1158    Visit Number 8    Number of Visits 12    Date for PT Re-Evaluation 03/01/20    PT Start Time 1147    PT Stop Time 1225    PT Time Calculation (min) 38 min    Activity Tolerance Patient tolerated treatment well    Behavior During Therapy [facility] for tasks assessed/performed           Past Medical History:  Diagnosis Date  . Anemia   . Arthritis   . Depression   . GERD (gastroesophageal reflux disease)   . Headache(784.0)    in past  . History of melanoma excision   . PONV (postoperative nausea and vomiting)   . Unilateral primary osteoarthritis, left hip 02/09/2019    Past Surgical History:  Procedure Laterality Date  . APPENDECTOMY  1970's  . BREAST ENHANCEMENT SURGERY    . SKIN GRAFT  1980's  . TONSILLECTOMY    . TOTAL HIP ARTHROPLASTY Right 07/29/2012   Procedure: RIGHT TOTAL HIP ARTHROPLASTY ANTERIOR APPROACH;  Surgeon: Mcarthur Rossetti, MD;  Location: [facility];  Service: Orthopedics;  Laterality: Right;  . TOTAL HIP ARTHROPLASTY Left 02/10/2019   Procedure: LEFT TOTAL HIP ARTHROPLASTY ANTERIOR APPROACH;  Surgeon: Mcarthur Rossetti, MD;  Location: [facility];  Service: Orthopedics;  Laterality: Left;    There were no vitals filed for this visit.   Subjective Assessment - 02/28/20 1149    Subjective Pt. stated feeling like she has made progress but still having trouble on Lt side.  She did move her tv.    Diagnostic tests neck XR "Degenerative changes throughout the mid cervical spine with endplate spurring and diminished disc space.  No acute fractures.  Loss of lordotic curvature"    Patient Stated Goals reduce pain     Currently in Pain? Yes    Pain Score 2     Pain Location Neck    Pain Orientation Left    Pain Onset More than a month ago                             [facility] Adult PT Treatment/Exercise - 02/28/20 0001      Neck Exercises: Machines for Strengthening   UBE (Upper Arm Bike) Lvl 4 3 mins fwd/back each way      Neck Exercises: Theraband   Other Theraband Exercises UE ER c scap retraction green band 3 x 10      Neck Exercises: Supine   Other Supine Exercise horizontal abduction green 3 x 10      Manual Therapy   Manual therapy comments compression to Lt upper trap, Lt lower and mid cervical downslope mobs g2-g3      Neck Exercises: Stretches   Upper Trapezius Stretch Right   15 sec x 3   Levator Stretch 3 reps;Right   15 sec x 3                      PT Long Term Goals - 02/09/20 0840      PT LONG TERM GOAL #1   Title Pt to be  independent with final HEP    Baseline some compliance    Time 6    Period Weeks    Status On-going      PT LONG TERM GOAL #2   Title Pt will improve neck ROM to [facility].    Baseline improving    Time 6    Period Weeks    Status On-going      PT LONG TERM GOAL #3   Title Pt will report improved sleep quality due to less neck pain/discomfort    Baseline still bothers her with sleeping    Time 6    Period Weeks    Status On-going      PT LONG TERM GOAL #4   Title Pt to report decreased neck pain to less than 2-3 overall with activity, work, neck ROM    Baseline has been at 2-3 over last week    Time 6    Period Weeks    Status Achieved      PT LONG TERM GOAL #5   Title FOTO functional outcome score will improve to 63% indicating improved function with less pain.    Baseline 53    Time 6    Period Weeks    Status On-going                 Plan - 02/28/20 1210    Clinical Impression Statement Pt. benefited from myofascial release techniques on Lt upper trap and cervical mobs today for improved  mobility.  Pt. may continue to benefit from HEP and skilled PT to address myofascial restriction and promote improved mechanics and postural awareness while performing work activity.    Examination-Activity Limitations Carry;Lift;Reach Overhead    Examination-Participation Restrictions Driving;Occupation    Stability/Clinical Decision Making Stable/Uncomplicated    Rehab Potential Good    PT Frequency 2x / week   1-2   PT Duration 6 weeks    PT Treatment/Interventions ADLs/Self Care Home Management;Cryotherapy;Electrical Stimulation;Iontophoresis 4mg /ml Dexamethasone;Moist Heat;Traction;Ultrasound;Therapeutic exercise;Neuromuscular re-education;Manual techniques;Passive range of motion;Dry needling;Taping;Spinal Manipulations;Joint Manipulations    PT Next Visit Plan Reassessment/recert next visit    PT Home Exercise Plan Access Code: [email or web address]    Consulted and Agree with Plan of Care Patient           Patient will benefit from skilled therapeutic intervention in order to improve the following deficits and impairments:  Decreased activity tolerance,Decreased mobility,Decreased range of motion,Decreased strength,Hypomobility,Impaired flexibility,Increased muscle spasms,Increased fascial restricitons,Pain  Visit Diagnosis: Cervicalgia  Muscle weakness (generalized)  Other abnormalities of gait and mobility     Problem List Patient Active Problem List   Diagnosis Date Noted  . Status post total replacement of left hip 02/10/2019  . H/O total hip arthroplasty 02/10/2019  . Unilateral primary osteoarthritis, left hip 02/09/2019  . Trochanteric bursitis, left hip 04/19/2017  . It band syndrome, left 04/19/2017  . Leukocytopenia 03/08/2013  . Macular degeneration (senile) of retina 11/23/2012  . Anemia 11/23/2012  . Anxiety state 08/13/2012  . 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 Disorder of muscle, ligament, and fascia 08/13/2012  . Enthesopathy of hip region 08/13/2012  .  Degenerative arthritis of hip 07/29/2012  . Melanoma of skin ([place]) 05/07/2009  . Meningitis 05/07/2009    Scot Jun, PT, DPT, OCS, ATC 02/28/20  12:21 PM    [facility] [address] [place], [place], [address] Phone: [phone]   Fax:  [phone]  Name: Autumn Lindsey MRN: [id number] Date of Birth: March 02, 1951

## 2020-03-13 ENCOUNTER — Encounter: Payer: Self-pay | Admitting: Rehabilitative and Restorative Service Providers"

## 2020-03-13 ENCOUNTER — Ambulatory Visit: Payer: BC Managed Care – PPO | Admitting: Rehabilitative and Restorative Service Providers"

## 2020-03-13 ENCOUNTER — Other Ambulatory Visit: Payer: Self-pay

## 2020-03-13 DIAGNOSIS — R2689 Other abnormalities of gait and mobility: Secondary | ICD-10-CM

## 2020-03-13 DIAGNOSIS — M6281 Muscle weakness (generalized): Secondary | ICD-10-CM

## 2020-03-13 DIAGNOSIS — M542 Cervicalgia: Secondary | ICD-10-CM | POA: Diagnosis not present

## 2020-03-13 NOTE — Therapy (Signed)
[facility] [address] [place], [place], [address] Phone: [phone]   Fax:  [phone]  Physical Therapy Treatment/Recertification  Patient Details  Name: Autumn Lindsey MRN: [id number] Date of Birth: 06-28-1950 Referring Provider (PT): Kriste Basque   Encounter Date: 03/13/2020   Progress Note Reporting Period 01/19/2020 to 03/13/2020  See note below for Objective Data and Assessment of Progress/Goals.        PT End of Session - 03/13/20 1533    Visit Number 9    Number of Visits 12    Date for PT Re-Evaluation 04/24/20    PT Start Time 1519    PT Stop Time 1558    PT Time Calculation (min) 39 min    Activity Tolerance Patient tolerated treatment well    Behavior During Therapy [facility] for tasks assessed/performed           Past Medical History:  Diagnosis Date  . Anemia   . Arthritis   . Depression   . GERD (gastroesophageal reflux disease)   . Headache(784.0)    in past  . History of melanoma excision   . PONV (postoperative nausea and vomiting)   . Unilateral primary osteoarthritis, left hip 02/09/2019    Past Surgical History:  Procedure Laterality Date  . APPENDECTOMY  1970's  . BREAST ENHANCEMENT SURGERY    . SKIN GRAFT  1980's  . TONSILLECTOMY    . TOTAL HIP ARTHROPLASTY Right 07/29/2012   Procedure: RIGHT TOTAL HIP ARTHROPLASTY ANTERIOR APPROACH;  Surgeon: Kathryne Hitch, MD;  Location: [facility];  Service: Orthopedics;  Laterality: Right;  . TOTAL HIP ARTHROPLASTY Left 02/10/2019   Procedure: LEFT TOTAL HIP ARTHROPLASTY ANTERIOR APPROACH;  Surgeon: Kathryne Hitch, MD;  Location: [facility];  Service: Orthopedics;  Laterality: Left;    There were no vitals filed for this visit.   Subjective Assessment - 03/13/20 1514    Subjective Pt. stated feeling about a day of improvement from last visit and then neck sypmptoms seemed to return.  Having complaints in Lt side of neck, more lower today.     Diagnostic tests neck XR "Degenerative changes throughout the mid cervical spine with endplate spurring and diminished disc space.  No acute fractures.  Loss of lordotic curvature"    Patient Stated Goals reduce pain    Currently in Pain? Yes    Pain Score 2    at worst in last week 3-4/10   Pain Location Neck    Pain Orientation Left    Pain Descriptors / Indicators Aching;Tightness    Pain Type Chronic pain    Pain Onset More than a month ago    Aggravating Factors  turning head, driving, sitting at desk.              [facility] PT Assessment - 03/13/20 0001      Assessment   Medical Diagnosis chronic neck pain    Referring Provider (PT) Kriste Basque      Observation/Other Assessments   Focus on Therapeutic Outcomes (FOTO)  63 % update      AROM   Cervical Flexion 60    Cervical Extension 50    Cervical - Right Rotation 60    Cervical - Left Rotation 60                         [facility] Adult PT Treatment/Exercise - 03/13/20 0001      Neck Exercises: Machines for Strengthening  UBE (Upper Arm Bike) Lvl 5 5 mins fwd UE/LE, lvl 3 3 mins backward UE only      Neck Exercises: Supine   Other Supine Exercise horizontal abduction blue 3 x 10, uc flexion hold 5 sec x 10      Manual Therapy   Manual therapy comments compression to Lt upper trap, levator.  cervical distraction      Neck Exercises: Stretches   Upper Trapezius Stretch Left   15 sec x 3   Levator Stretch Left   15 sec x 3                      PT Long Term Goals - 03/13/20 1549      PT LONG TERM GOAL #1   Title Pt to be independent with final HEP    Baseline some compliance    Time 6    Period Weeks    Status On-going    Target Date 04/24/20      PT LONG TERM GOAL #2   Title Pt will improve neck ROM to [facility].    Baseline improving    Time 6    Period Weeks    Status On-going    Target Date 04/24/20      PT LONG TERM GOAL #3   Title Pt will report improved sleep quality due to  less neck pain/discomfort    Baseline still bothers her with sleeping    Time 6    Period Weeks    Status Achieved      PT LONG TERM GOAL #4   Title Pt to report decreased neck pain to less than 2-3 overall with activity, work, neck ROM    Baseline has been at 2-3 over last week    Time 6    Period Weeks    Status On-going    Target Date 04/24/20      PT LONG TERM GOAL #5   Title FOTO functional outcome score will improve to 63% indicating improved function with less pain.    Baseline 53    Time 6    Period Weeks    Status Achieved                 Plan - 03/13/20 1537    Clinical Impression Statement Pt. has attended 9 visits overall during course of treatment with symptoms at worst in last week at 4/10.  See objective data for updated information.  Pt. reported somewhat better +3 at this time on GROC and FOTO score improvement noted as well.  Pt. still has complaints of cervical pain c mobility deficits that impair daily functional activity in home and work ,driving at this time.  Recommended continued skilled PT services indicated at this time.    Examination-Activity Limitations Carry;Lift;Reach Overhead    Examination-Participation Restrictions Driving;Occupation    Stability/Clinical Decision Making Stable/Uncomplicated    Rehab Potential Good    PT Frequency --   1-2x/week   PT Duration 6 weeks    PT Treatment/Interventions ADLs/Self Care Home Management;Cryotherapy;Electrical Stimulation;Iontophoresis 4mg /ml Dexamethasone;Moist Heat;Traction;Ultrasound;Therapeutic exercise;Neuromuscular re-education;Manual techniques;Passive range of motion;Dry needling;Taping;Spinal Manipulations;Joint Manipulations    PT Next Visit Plan Continued myofascial release, mobility gains.    PT Home Exercise Plan Access Code: [email or web address]    Consulted and Agree with Plan of Care Patient           Patient will benefit from skilled therapeutic intervention in order to improve the following  deficits and impairments:  Decreased activity tolerance,Decreased mobility,Decreased range of motion,Decreased strength,Hypomobility,Impaired flexibility,Increased muscle spasms,Increased fascial restricitons,Pain  Visit Diagnosis: Cervicalgia  Muscle weakness (generalized)  Other abnormalities of gait and mobility     Problem List Patient Active Problem List   Diagnosis Date Noted  . Status post total replacement of left hip 02/10/2019  . H/O total hip arthroplasty 02/10/2019  . Unilateral primary osteoarthritis, left hip 02/09/2019  . Trochanteric bursitis, left hip 04/19/2017  . It band syndrome, left 04/19/2017  . Leukocytopenia 03/08/2013  . Macular degeneration (senile) of retina 11/23/2012  . Anemia 11/23/2012  . Anxiety state 08/13/2012  . 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 Disorder of muscle, ligament, and fascia 08/13/2012  . Enthesopathy of hip region 08/13/2012  . Degenerative arthritis of hip 07/29/2012  . Melanoma of skin ([place]) 05/07/2009  . Meningitis 05/07/2009    Scot Jun, PT, DPT, OCS, ATC 03/13/20  3:51 PM    [facility] [address] [place], [place], [address] Phone: [phone]   Fax:  [phone]  Name: Autumn Lindsey MRN: [id number] Date of Birth: [birth year]

## 2020-03-15 ENCOUNTER — Ambulatory Visit (INDEPENDENT_AMBULATORY_CARE_PROVIDER_SITE_OTHER): Payer: BC Managed Care – PPO | Admitting: Rehabilitative and Restorative Service Providers"

## 2020-03-15 ENCOUNTER — Encounter: Payer: Self-pay | Admitting: Rehabilitative and Restorative Service Providers"

## 2020-03-15 ENCOUNTER — Other Ambulatory Visit: Payer: Self-pay

## 2020-03-15 DIAGNOSIS — M542 Cervicalgia: Secondary | ICD-10-CM

## 2020-03-15 DIAGNOSIS — R2689 Other abnormalities of gait and mobility: Secondary | ICD-10-CM

## 2020-03-15 DIAGNOSIS — M6281 Muscle weakness (generalized): Secondary | ICD-10-CM | POA: Diagnosis not present

## 2020-03-15 NOTE — Therapy (Signed)
[facility] [address] [place], [place], [address] Phone: [phone]   Fax:  [phone]  Physical Therapy Treatment  Patient Details  Name: Autumn Lindsey MRN: [id number] Date of Birth: [birth year] Referring Provider (PT): Carney Bern   Encounter Date: 03/15/2020   PT End of Session - 03/15/20 0918    Visit Number 10    Number of Visits 12    Date for PT Re-Evaluation 04/24/20    PT Start Time 0847    PT Stop Time 0925    PT Time Calculation (min) 38 min    Activity Tolerance Patient tolerated treatment well    Behavior During Therapy [facility] for tasks assessed/performed           Past Medical History:  Diagnosis Date  . Anemia   . Arthritis   . Depression   . GERD (gastroesophageal reflux disease)   . Headache(784.0)    in past  . History of melanoma excision   . PONV (postoperative nausea and vomiting)   . Unilateral primary osteoarthritis, left hip 02/09/2019    Past Surgical History:  Procedure Laterality Date  . APPENDECTOMY  1970's  . BREAST ENHANCEMENT SURGERY    . SKIN GRAFT  1980's  . TONSILLECTOMY    . TOTAL HIP ARTHROPLASTY Right 07/29/2012   Procedure: RIGHT TOTAL HIP ARTHROPLASTY ANTERIOR APPROACH;  Surgeon: Mcarthur Rossetti, MD;  Location: [facility];  Service: Orthopedics;  Laterality: Right;  . TOTAL HIP ARTHROPLASTY Left 02/10/2019   Procedure: LEFT TOTAL HIP ARTHROPLASTY ANTERIOR APPROACH;  Surgeon: Mcarthur Rossetti, MD;  Location: [facility];  Service: Orthopedics;  Laterality: Left;    There were no vitals filed for this visit.   Subjective Assessment - 03/15/20 0910    Subjective Pt. stated she feels like she is making some progress overall in last few visits c some reduction in pain complaints.    Diagnostic tests neck XR "Degenerative changes throughout the mid cervical spine with endplate spurring and diminished disc space.  No acute fractures.  Loss of lordotic curvature"    Patient Stated Goals  reduce pain    Currently in Pain? Yes    Pain Score --   mild symptoms   Pain Location Neck    Pain Orientation Left    Pain Descriptors / Indicators Aching;Tightness    Pain Onset More than a month ago                             [facility] Adult PT Treatment/Exercise - 03/15/20 0001      Neck Exercises: Machines for Strengthening   UBE (Upper Arm Bike) Lvl 3.5 5 mins fwd/back each way      Neck Exercises: Theraband   Shoulder Extension Blue   3 x 15   Rows Blue   3 x 15   Other Theraband Exercises blue band bilateral UE c scap retraction      Manual Therapy   Manual therapy comments compression to Lt upper trap, levator, percussive device to same                       PT Long Term Goals - 03/13/20 1549      PT LONG TERM GOAL #1   Title Pt to be independent with final HEP    Baseline some compliance    Time 6    Period Weeks    Status On-going  Target Date 04/24/20      PT LONG TERM GOAL #2   Title Pt will improve neck ROM to [facility].    Baseline improving    Time 6    Period Weeks    Status On-going    Target Date 04/24/20      PT LONG TERM GOAL #3   Title Pt will report improved sleep quality due to less neck pain/discomfort    Baseline still bothers her with sleeping    Time 6    Period Weeks    Status Achieved      PT LONG TERM GOAL #4   Title Pt to report decreased neck pain to less than 2-3 overall with activity, work, neck ROM    Baseline has been at 2-3 over last week    Time 6    Period Weeks    Status On-going    Target Date 04/24/20      PT LONG TERM GOAL #5   Title FOTO functional outcome score will improve to 63% indicating improved function with less pain.    Baseline 53    Time 6    Period Weeks    Status Achieved                 Plan - 03/15/20 0916    Clinical Impression Statement Pt. seemed to benefit from active compression movement and percussive device. Continued progression indicated c continued  manual treatment.    Examination-Activity Limitations Carry;Lift;Reach Overhead    Examination-Participation Restrictions Driving;Occupation    Stability/Clinical Decision Making Stable/Uncomplicated    Rehab Potential Good    PT Frequency --   1-2x/week   PT Duration 6 weeks    PT Treatment/Interventions ADLs/Self Care Home Management;Cryotherapy;Electrical Stimulation;Iontophoresis 4mg /ml Dexamethasone;Moist Heat;Traction;Ultrasound;Therapeutic exercise;Neuromuscular re-education;Manual techniques;Passive range of motion;Dry needling;Taping;Spinal Manipulations;Joint Manipulations    PT Next Visit Plan Continued myofascial release, mobility gains.    PT Home Exercise Plan Access Code: [email or web address]    Consulted and Agree with Plan of Care Patient           Patient will benefit from skilled therapeutic intervention in order to improve the following deficits and impairments:  Decreased activity tolerance,Decreased mobility,Decreased range of motion,Decreased strength,Hypomobility,Impaired flexibility,Increased muscle spasms,Increased fascial restricitons,Pain  Visit Diagnosis: Cervicalgia  Muscle weakness (generalized)  Other abnormalities of gait and mobility     Problem List Patient Active Problem List   Diagnosis Date Noted  . Status post total replacement of left hip 02/10/2019  . H/O total hip arthroplasty 02/10/2019  . Unilateral primary osteoarthritis, left hip 02/09/2019  . Trochanteric bursitis, left hip 04/19/2017  . It band syndrome, left 04/19/2017  . Leukocytopenia 03/08/2013  . Macular degeneration (senile) of retina 11/23/2012  . Anemia 11/23/2012  . Anxiety state 08/13/2012  . 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 Disorder of muscle, ligament, and fascia 08/13/2012  . Enthesopathy of hip region 08/13/2012  . Degenerative arthritis of hip 07/29/2012  . Melanoma of skin ([place]) 05/07/2009  . Meningitis 05/07/2009   Scot Jun, PT, DPT, OCS, ATC 03/15/20   9:20 AM    [facility] [address] [place], [place], [address] Phone: [phone]   Fax:  [phone]  Name: Simran Bomkamp MRN: [id number] Date of Birth: 09-11-1950

## 2020-03-18 ENCOUNTER — Telehealth: Payer: Self-pay | Admitting: Rehabilitative and Restorative Service Providers"

## 2020-03-18 ENCOUNTER — Encounter: Payer: Commercial Managed Care - PPO | Admitting: Rehabilitative and Restorative Service Providers"

## 2020-03-18 NOTE — Telephone Encounter (Signed)
Called after 18 mins no show for appointment time at 8am.  Unable to leave message as mailbox was full.   Scot Jun, PT, DPT, OCS, ATC 03/18/20  8:18 AM

## 2020-03-20 ENCOUNTER — Other Ambulatory Visit: Payer: Self-pay

## 2020-03-20 ENCOUNTER — Ambulatory Visit: Payer: BC Managed Care – PPO | Admitting: Rehabilitative and Restorative Service Providers"

## 2020-03-20 ENCOUNTER — Encounter: Payer: Self-pay | Admitting: Rehabilitative and Restorative Service Providers"

## 2020-03-20 DIAGNOSIS — M542 Cervicalgia: Secondary | ICD-10-CM

## 2020-03-20 DIAGNOSIS — M6281 Muscle weakness (generalized): Secondary | ICD-10-CM

## 2020-03-20 NOTE — Therapy (Signed)
[place], [place], [address] Phone: [phone]   Fax:  [phone]  Physical Therapy Treatment  Patient Details  Name: Autumn Lindsey MRN: [id number] Date of Birth: 26-Jan-1951 Referring Provider (PT): Carney Bern   Encounter Date: 03/20/2020   PT End of Session - 03/20/20 0803    Visit Number 11    Number of Visits 12    Date for PT Re-Evaluation 04/24/20    PT Start Time 0800    PT Stop Time 0839    PT Time Calculation (min) 39 min    Activity Tolerance Patient tolerated treatment well    Behavior During Therapy [facility] for tasks assessed/performed           Past Medical History:  Diagnosis Date  . Anemia   . Arthritis   . Depression   . GERD (gastroesophageal reflux disease)   . Headache(784.0)    in past  . History of melanoma excision   . PONV (postoperative nausea and vomiting)   . Unilateral primary osteoarthritis, left hip 02/09/2019    Past Surgical History:  Procedure Laterality Date  . APPENDECTOMY  1970's  . BREAST ENHANCEMENT SURGERY    . SKIN GRAFT  1980's  . TONSILLECTOMY    . TOTAL HIP ARTHROPLASTY Right 07/29/2012   Procedure: RIGHT TOTAL HIP ARTHROPLASTY ANTERIOR APPROACH;  Surgeon: Mcarthur Rossetti, MD;  Location: [facility];  Service: Orthopedics;  Laterality: Right;  . TOTAL HIP ARTHROPLASTY Left 02/10/2019   Procedure: LEFT TOTAL HIP ARTHROPLASTY ANTERIOR APPROACH;  Surgeon: Mcarthur Rossetti, MD;  Location: [facility];  Service: Orthopedics;  Laterality: Left;    There were no vitals filed for this visit.   Subjective Assessment - 03/20/20 0807    Subjective Pt. indicated forgetting last appointment due work related business.  Pt. stated squeezing option at home was helpful.    Diagnostic tests neck XR "Degenerative changes throughout the mid cervical spine with endplate spurring and diminished disc space.  No acute fractures.  Loss of lordotic curvature"    Patient Stated  Goals reduce pain    Currently in Pain? No/denies    Pain Score 2     Pain Location Neck    Pain Orientation Left    Pain Onset More than a month ago                             [facility] Adult PT Treatment/Exercise - 03/20/20 0001      Neck Exercises: Machines for Strengthening   UBE (Upper Arm Bike) Lvl 3 4 min fwd/back each way      Neck Exercises: Theraband   Shoulder Extension Blue   3 x 15   Rows Blue   3 x 15   Other Theraband Exercises blue band ER in neutral 3 x 10 bilateral, lat pull down blue band 3 x 10      Neck Exercises: Supine   Other Supine Exercise 5 sec hold x 15      Manual Therapy   Manual therapy comments compression to Lt upper trap/levator                       PT Long Term Goals - 03/13/20 1549      PT LONG TERM GOAL #1   Title Pt to be independent with final HEP    Baseline some compliance    Time 6  Period Weeks    Status On-going    Target Date 04/24/20      PT LONG TERM GOAL #2   Title Pt will improve neck ROM to [facility].    Baseline improving    Time 6    Period Weeks    Status On-going    Target Date 04/24/20      PT LONG TERM GOAL #3   Title Pt will report improved sleep quality due to less neck pain/discomfort    Baseline still bothers her with sleeping    Time 6    Period Weeks    Status Achieved      PT LONG TERM GOAL #4   Title Pt to report decreased neck pain to less than 2-3 overall with activity, work, neck ROM    Baseline has been at 2-3 over last week    Time 6    Period Weeks    Status On-going    Target Date 04/24/20      PT LONG TERM GOAL #5   Title FOTO functional outcome score will improve to 63% indicating improved function with less pain.    Baseline 53    Time 6    Period Weeks    Status Achieved                 Plan - 03/20/20 0350    Clinical Impression Statement Benefits from self compression to TrP noted in use over weekend.  Continued emphasis on routine HEP and  self management to facilitate transitioning to HEP use for long term care as symptoms indicated.    Examination-Activity Limitations Carry;Lift;Reach Overhead    Examination-Participation Restrictions Driving;Occupation    Stability/Clinical Decision Making Stable/Uncomplicated    Rehab Potential Good    PT Frequency --   1-2x/week   PT Duration 6 weeks    PT Treatment/Interventions ADLs/Self Care Home Management;Cryotherapy;Electrical Stimulation;Iontophoresis 4mg /ml Dexamethasone;Moist Heat;Traction;Ultrasound;Therapeutic exercise;Neuromuscular re-education;Manual techniques;Passive range of motion;Dry needling;Taping;Spinal Manipulations;Joint Manipulations    PT Next Visit Plan Continued myofascial release, mobility gains.    PT Home Exercise Plan Access Code: [email or web address]    Consulted and Agree with Plan of Care Patient           Patient will benefit from skilled therapeutic intervention in order to improve the following deficits and impairments:  Decreased activity tolerance,Decreased mobility,Decreased range of motion,Decreased strength,Hypomobility,Impaired flexibility,Increased muscle spasms,Increased fascial restricitons,Pain  Visit Diagnosis: Cervicalgia  Muscle weakness (generalized)     Problem List Patient Active Problem List   Diagnosis Date Noted  . Status post total replacement of left hip 02/10/2019  . H/O total hip arthroplasty 02/10/2019  . Unilateral primary osteoarthritis, left hip 02/09/2019  . Trochanteric bursitis, left hip 04/19/2017  . It band syndrome, left 04/19/2017  . Leukocytopenia 03/08/2013  . Macular degeneration (senile) of retina 11/23/2012  . Anemia 11/23/2012  . Anxiety state 08/13/2012  . 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 Disorder of muscle, ligament, and fascia 08/13/2012  . Enthesopathy of hip region 08/13/2012  . Degenerative arthritis of hip 07/29/2012  . Melanoma of skin ([place]) 05/07/2009  . Meningitis 05/07/2009    Scot Jun, PT, DPT, OCS, ATC 03/20/20  8:40 AM    [facility] [address] [place], [place], [address] Phone: [phone]   Fax:  [phone]  Name: Autumn Lindsey MRN: [id number] Date of Birth: Jul 15, 1950

## 2020-03-26 ENCOUNTER — Encounter: Payer: Commercial Managed Care - PPO | Admitting: Rehabilitative and Restorative Service Providers"

## 2020-03-28 ENCOUNTER — Encounter: Payer: Commercial Managed Care - PPO | Admitting: Rehabilitative and Restorative Service Providers"

## 2020-04-02 ENCOUNTER — Other Ambulatory Visit: Payer: Self-pay

## 2020-04-02 ENCOUNTER — Ambulatory Visit: Payer: BC Managed Care – PPO | Admitting: Rehabilitative and Restorative Service Providers"

## 2020-04-02 DIAGNOSIS — M6281 Muscle weakness (generalized): Secondary | ICD-10-CM

## 2020-04-02 DIAGNOSIS — M542 Cervicalgia: Secondary | ICD-10-CM

## 2020-04-02 NOTE — Therapy (Signed)
[place], [place], [address] Phone: [phone]   Fax:  [phone]  Physical Therapy Treatment  Patient Details  Name: Autumn Lindsey MRN: [id number] Date of Birth: 1950-03-23 Referring Provider (PT): Carney Bern   Encounter Date: 04/02/2020   PT End of Session - 04/02/20 0856    Visit Number 12    Number of Visits 16   adjusted to reflect date for POC   Date for PT Re-Evaluation 04/24/20    PT Start Time 0856    PT Stop Time 0921    PT Time Calculation (min) 25 min    Activity Tolerance Patient tolerated treatment well    Behavior During Therapy [facility] for tasks assessed/performed           Past Medical History:  Diagnosis Date  . Anemia   . Arthritis   . Depression   . GERD (gastroesophageal reflux disease)   . Headache(784.0)    in past  . History of melanoma excision   . PONV (postoperative nausea and vomiting)   . Unilateral primary osteoarthritis, left hip 02/09/2019    Past Surgical History:  Procedure Laterality Date  . APPENDECTOMY  1970's  . BREAST ENHANCEMENT SURGERY    . SKIN GRAFT  1980's  . TONSILLECTOMY    . TOTAL HIP ARTHROPLASTY Right 07/29/2012   Procedure: RIGHT TOTAL HIP ARTHROPLASTY ANTERIOR APPROACH;  Surgeon: Mcarthur Rossetti, MD;  [facility]: [facility];  Service: Orthopedics;  Laterality: Right;  . TOTAL HIP ARTHROPLASTY Left 02/10/2019   Procedure: LEFT TOTAL HIP ARTHROPLASTY ANTERIOR APPROACH;  Surgeon: Mcarthur Rossetti, MD;  [facility];  Service: Orthopedics;  Laterality: Left;    There were no vitals filed for this visit.   Subjective Assessment - 04/02/20 0859    Subjective Pt. indicated feeling better and managing at home.    Diagnostic tests neck XR "Degenerative changes throughout the mid cervical spine with endplate spurring and diminished disc space.  No acute fractures.  Loss of lordotic curvature"    Patient Stated Goals reduce pain    Currently in  Pain? No/denies    Pain Location Neck    Pain Orientation Left    Pain Descriptors / Indicators Aching;Tightness    Pain Type Chronic pain    Pain Onset More than a month ago    Pain Frequency Occasional                             [facility] Adult PT Treatment/Exercise - 04/02/20 0001      Neck Exercises: Machines for Strengthening   UBE (Upper Arm Bike) Lvl 3 3 mins fwd/back each way      Manual Therapy   Manual therapy comments compression, percussive device to Lt upper trap/levator                       PT Long Term Goals - 03/13/20 1549      PT LONG TERM GOAL #1   Title Pt to be independent with final HEP    Baseline some compliance    Time 6    Period Weeks    Status On-going    Target Date 04/24/20      PT LONG TERM GOAL #2   Title Pt will improve neck ROM to [facility].    Baseline improving    Time 6    Period Weeks    Status On-going  Target Date 04/24/20      PT LONG TERM GOAL #3   Title Pt will report improved sleep quality due to less neck pain/discomfort    Baseline still bothers her with sleeping    Time 6    Period Weeks    Status Achieved      PT LONG TERM GOAL #4   Title Pt to report decreased neck pain to less than 2-3 overall with activity, work, neck ROM    Baseline has been at 2-3 over last week    Time 6    Period Weeks    Status On-going    Target Date 04/24/20      PT LONG TERM GOAL #5   Title FOTO functional outcome score will improve to 63% indicating improved function with less pain.    Baseline 53    Time 6    Period Weeks    Status Achieved                 Plan - 04/02/20 0917    Clinical Impression Statement Continued trending to reduced severity of symptoms and improved home management of symptoms going forward at this time.  Encouraged continued use of HEP.    Examination-Activity Limitations Carry;Lift;Reach Overhead    Examination-Participation Restrictions Driving;Occupation     Stability/Clinical Decision Making Stable/Uncomplicated    Rehab Potential Good    PT Frequency --   1-2x/week   PT Duration 6 weeks    PT Treatment/Interventions ADLs/Self Care Home Management;Cryotherapy;Electrical Stimulation;Iontophoresis 4mg /ml Dexamethasone;Moist Heat;Traction;Ultrasound;Therapeutic exercise;Neuromuscular re-education;Manual techniques;Passive range of motion;Dry needling;Taping;Spinal Manipulations;Joint Manipulations    PT Next Visit Plan Continued myofascial release techniques, possible d/c to HEP    PT Home Exercise Plan Access Code: [email or web address]    Consulted and Agree with Plan of Care Patient           Patient will benefit from skilled therapeutic intervention in order to improve the following deficits and impairments:  Decreased activity tolerance,Decreased mobility,Decreased range of motion,Decreased strength,Hypomobility,Impaired flexibility,Increased muscle spasms,Increased fascial restricitons,Pain  Visit Diagnosis: Cervicalgia  Muscle weakness (generalized)     Problem List Patient Active Problem List   Diagnosis Date Noted  . Status post total replacement of left hip 02/10/2019  . H/O total hip arthroplasty 02/10/2019  . Unilateral primary osteoarthritis, left hip 02/09/2019  . Trochanteric bursitis, left hip 04/19/2017  . It band syndrome, left 04/19/2017  . Leukocytopenia 03/08/2013  . Macular degeneration (senile) of retina 11/23/2012  . Anemia 11/23/2012  . Anxiety state 08/13/2012  . 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 Disorder of muscle, ligament, and fascia 08/13/2012  . Enthesopathy of hip region 08/13/2012  . Degenerative arthritis of hip 07/29/2012  . Melanoma of skin ([place]) 05/07/2009  . Meningitis 05/07/2009   Scot Jun, PT, DPT, OCS, ATC 04/02/20  9:24 AM    [facility] [address] [place], [place], [address] Phone: [phone]   Fax:  [phone]  Name: Autumn Lindsey MRN:  [id number] Date of Birth: Jul 30, 1950

## 2020-04-04 ENCOUNTER — Encounter: Payer: Self-pay | Admitting: Rehabilitative and Restorative Service Providers"

## 2020-04-04 ENCOUNTER — Ambulatory Visit: Payer: BC Managed Care – PPO | Admitting: Rehabilitative and Restorative Service Providers"

## 2020-04-04 ENCOUNTER — Other Ambulatory Visit: Payer: Self-pay

## 2020-04-04 DIAGNOSIS — M542 Cervicalgia: Secondary | ICD-10-CM

## 2020-04-04 DIAGNOSIS — M6281 Muscle weakness (generalized): Secondary | ICD-10-CM | POA: Diagnosis not present

## 2020-04-04 NOTE — Therapy (Signed)
[place], [place], [address] Phone: [phone]   Fax:  [phone]  Physical Therapy Treatment  Patient Details  Name: Autumn Lindsey MRN: [id number] Date of Birth: [birth year] Referring Provider (PT): Carney Bern   Encounter Date: 04/04/2020   PT End of Session - 04/04/20 0915    Visit Number 13    Number of Visits 16   adjusted to reflect date for POC   Date for PT Re-Evaluation 04/24/20    PT Start Time 0844    PT Stop Time 0922    PT Time Calculation (min) 38 min    Activity Tolerance Patient tolerated treatment well    Behavior During Therapy [facility] for tasks assessed/performed           Past Medical History:  Diagnosis Date  . Anemia   . Arthritis   . Depression   . GERD (gastroesophageal reflux disease)   . Headache(784.0)    in past  . History of melanoma excision   . PONV (postoperative nausea and vomiting)   . Unilateral primary osteoarthritis, left hip 02/09/2019    Past Surgical History:  Procedure Laterality Date  . APPENDECTOMY  1970's  . BREAST ENHANCEMENT SURGERY    . SKIN GRAFT  1980's  . TONSILLECTOMY    . TOTAL HIP ARTHROPLASTY Right 07/29/2012   Procedure: RIGHT TOTAL HIP ARTHROPLASTY ANTERIOR APPROACH;  Surgeon: Mcarthur Rossetti, MD;  Location: [facility];  Service: Orthopedics;  Laterality: Right;  . TOTAL HIP ARTHROPLASTY Left 02/10/2019   Procedure: LEFT TOTAL HIP ARTHROPLASTY ANTERIOR APPROACH;  Surgeon: Mcarthur Rossetti, MD;  Location: [facility];  Service: Orthopedics;  Laterality: Left;    There were no vitals filed for this visit.   Subjective Assessment - 04/04/20 0850    Subjective Pt. stated still having mild symptoms along the way but managing at home better with HEP.    Diagnostic tests neck XR "Degenerative changes throughout the mid cervical spine with endplate spurring and diminished disc space.  No acute fractures.  Loss of lordotic curvature"    Patient Stated  Goals reduce pain    Currently in Pain? Yes   mild   Pain Onset More than a month ago                             [facility] Adult PT Treatment/Exercise - 04/04/20 0001      Neck Exercises: Machines for Strengthening   UBE (Upper Arm Bike) Lvl 3.5 2.5 mins fwd/back 10 mins total      Neck Exercises: Standing   Other Standing Exercises blue band rows, gh ext 3 x 15      Manual Therapy   Manual therapy comments compression, percussive device to Lt upper trap, levator scap      Neck Exercises: Stretches   Other Neck Stretches levator scap Lt stretch c Lt UE elevation MWM 2 x 10                       PT Long Term Goals - 03/13/20 1549      PT LONG TERM GOAL #1   Title Pt to be independent with final HEP    Baseline some compliance    Time 6    Period Weeks    Status On-going    Target Date 04/24/20      PT LONG TERM GOAL #2   Title Pt  will improve neck ROM to [facility].    Baseline improving    Time 6    Period Weeks    Status On-going    Target Date 04/24/20      PT LONG TERM GOAL #3   Title Pt will report improved sleep quality due to less neck pain/discomfort    Baseline still bothers her with sleeping    Time 6    Period Weeks    Status Achieved      PT LONG TERM GOAL #4   Title Pt to report decreased neck pain to less than 2-3 overall with activity, work, neck ROM    Baseline has been at 2-3 over last week    Time 6    Period Weeks    Status On-going    Target Date 04/24/20      PT LONG TERM GOAL #5   Title FOTO functional outcome score will improve to 63% indicating improved function with less pain.    Baseline 53    Time 6    Period Weeks    Status Achieved                 Plan - 04/04/20 0916    Clinical Impression Statement Progressed stretching program to improve mobility of levator and upper trap due to overall improvements.  Reduced frequency of visits in next week to eas transitioning to HEP.  Continued myofascial  restriction noted connected to symptoms.    Examination-Activity Limitations Carry;Lift;Reach Overhead    Examination-Participation Restrictions Driving;Occupation    Stability/Clinical Decision Making Stable/Uncomplicated    Rehab Potential Good    PT Frequency --   1-2x/week   PT Duration 6 weeks    PT Treatment/Interventions ADLs/Self Care Home Management;Cryotherapy;Electrical Stimulation;Iontophoresis 4mg /ml Dexamethasone;Moist Heat;Traction;Ultrasound;Therapeutic exercise;Neuromuscular re-education;Manual techniques;Passive range of motion;Dry needling;Taping;Spinal Manipulations;Joint Manipulations    PT Next Visit Plan Continued myofascial release techniques, possible d/c to HEP in next 2 weeks    PT Home Exercise Plan Access Code: [email or web address]    Consulted and Agree with Plan of Care Patient           Patient will benefit from skilled therapeutic intervention in order to improve the following deficits and impairments:  Decreased activity tolerance,Decreased mobility,Decreased range of motion,Decreased strength,Hypomobility,Impaired flexibility,Increased muscle spasms,Increased fascial restricitons,Pain  Visit Diagnosis: Cervicalgia  Muscle weakness (generalized)     Problem List Patient Active Problem List   Diagnosis Date Noted  . Status post total replacement of left hip 02/10/2019  . H/O total hip arthroplasty 02/10/2019  . Unilateral primary osteoarthritis, left hip 02/09/2019  . Trochanteric bursitis, left hip 04/19/2017  . It band syndrome, left 04/19/2017  . Leukocytopenia 03/08/2013  . Macular degeneration (senile) of retina 11/23/2012  . Anemia 11/23/2012  . Anxiety state 08/13/2012  . 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 Disorder of muscle, ligament, and fascia 08/13/2012  . Enthesopathy of hip region 08/13/2012  . Degenerative arthritis of hip 07/29/2012  . Melanoma of skin ([place]) 05/07/2009  . Meningitis 05/07/2009    Scot Jun, PT, DPT, OCS,  ATC 04/04/20  9:22 AM    [facility] [address] [place], [place], [address] Phone: [phone]   Fax:  [phone]  Name: Autumn Lindsey MRN: [id number] Date of Birth: Jun 28, 1950

## 2020-04-09 ENCOUNTER — Ambulatory Visit (INDEPENDENT_AMBULATORY_CARE_PROVIDER_SITE_OTHER): Payer: BC Managed Care – PPO | Admitting: Rehabilitative and Restorative Service Providers"

## 2020-04-09 ENCOUNTER — Encounter: Payer: Self-pay | Admitting: Rehabilitative and Restorative Service Providers"

## 2020-04-09 ENCOUNTER — Other Ambulatory Visit: Payer: Self-pay

## 2020-04-09 DIAGNOSIS — M542 Cervicalgia: Secondary | ICD-10-CM

## 2020-04-09 DIAGNOSIS — M6281 Muscle weakness (generalized): Secondary | ICD-10-CM

## 2020-04-09 NOTE — Therapy (Signed)
[place], [place], [address] Phone: [phone]   Fax:  [phone]  Physical Therapy Treatment  Patient Details  Name: Autumn Lindsey MRN: [id number] Date of Birth: [birth year] Referring Provider (PT): Carney Bern   Encounter Date: 04/09/2020   PT End of Session - 04/09/20 0851    Visit Number 14    Number of Visits 16   adjusted to reflect date for POC   Date for PT Re-Evaluation 04/24/20    PT Start Time 0844    PT Stop Time 0923    PT Time Calculation (min) 39 min    Activity Tolerance Patient tolerated treatment well    Behavior During Therapy [facility] for tasks assessed/performed           Past Medical History:  Diagnosis Date  . Anemia   . Arthritis   . Depression   . GERD (gastroesophageal reflux disease)   . Headache(784.0)    in past  . History of melanoma excision   . PONV (postoperative nausea and vomiting)   . Unilateral primary osteoarthritis, left hip 02/09/2019    Past Surgical History:  Procedure Laterality Date  . APPENDECTOMY  1970's  . BREAST ENHANCEMENT SURGERY    . SKIN GRAFT  1980's  . TONSILLECTOMY    . TOTAL HIP ARTHROPLASTY Right 07/29/2012   Procedure: RIGHT TOTAL HIP ARTHROPLASTY ANTERIOR APPROACH;  Surgeon: Mcarthur Rossetti, MD;  [facility]: [facility];  Service: Orthopedics;  Laterality: Right;  . TOTAL HIP ARTHROPLASTY Left 02/10/2019   Procedure: LEFT TOTAL HIP ARTHROPLASTY ANTERIOR APPROACH;  Surgeon: Mcarthur Rossetti, MD;  [facility];  Service: Orthopedics;  Laterality: Left;    There were no vitals filed for this visit.   Subjective Assessment - 04/09/20 0849    Subjective Pt. indicated no specific changes, maintaining her progress to this point.    Diagnostic tests neck XR "Degenerative changes throughout the mid cervical spine with endplate spurring and diminished disc space.  No acute fractures.  Loss of lordotic curvature"    Patient Stated Goals reduce  pain    Currently in Pain? No/denies   no specific  complaints of pain at rest   Pain Onset More than a month ago                             [facility] Adult PT Treatment/Exercise - 04/09/20 0001      Exercises   Exercises Other Exercises    Other Exercises  Review of home dumbell exercises (biceps, triceps, bent over rows)      Neck Exercises: Machines for Strengthening   UBE (Upper Arm Bike) Fwd UE/LE lvl 5 5 mins, reverse level 3.5 UE only    Cybex Row 3 x 15 20 lbs    Lat Pull 3 x 15 20 lbs      Neck Exercises: Standing   Other Standing Exercises wall thoracic rotations blue band 2 x 10 bilateral      Manual Therapy   Manual therapy comments percussive thoracic Lt and Rt upper trap                       PT Long Term Goals - 03/13/20 1549      PT LONG TERM GOAL #1   Title Pt to be independent with final HEP    Baseline some compliance    Time 6    Period  Weeks    Status On-going    Target Date 04/24/20      PT LONG TERM GOAL #2   Title Pt will improve neck ROM to [facility].    Baseline improving    Time 6    Period Weeks    Status On-going    Target Date 04/24/20      PT LONG TERM GOAL #3   Title Pt will report improved sleep quality due to less neck pain/discomfort    Baseline still bothers her with sleeping    Time 6    Period Weeks    Status Achieved      PT LONG TERM GOAL #4   Title Pt to report decreased neck pain to less than 2-3 overall with activity, work, neck ROM    Baseline has been at 2-3 over last week    Time 6    Period Weeks    Status On-going    Target Date 04/24/20      PT LONG TERM GOAL #5   Title FOTO functional outcome score will improve to 63% indicating improved function with less pain.    Baseline 53    Time 6    Period Weeks    Status Achieved                 Plan - 04/09/20 0905    Clinical Impression Statement Continued progression in resistance activity and scapular strengthening for prep  for HEP transitioning.  Continued focus on loosening myofascial restrictions for symptom management.    Examination-Activity Limitations Carry;Lift;Reach Overhead    Examination-Participation Restrictions Driving;Occupation    Stability/Clinical Decision Making Stable/Uncomplicated    Rehab Potential Good    PT Frequency --   1-2x/week   PT Duration 6 weeks    PT Treatment/Interventions ADLs/Self Care Home Management;Cryotherapy;Electrical Stimulation;Iontophoresis 4mg /ml Dexamethasone;Moist Heat;Traction;Ultrasound;Therapeutic exercise;Neuromuscular re-education;Manual techniques;Passive range of motion;Dry needling;Taping;Spinal Manipulations;Joint Manipulations    PT Next Visit Plan Continued myofascial release techniques, possible d/c to HEP in next 2 weeks    PT Home Exercise Plan Access Code: [email or web address]    Consulted and Agree with Plan of Care Patient           Patient will benefit from skilled therapeutic intervention in order to improve the following deficits and impairments:  Decreased activity tolerance,Decreased mobility,Decreased range of motion,Decreased strength,Hypomobility,Impaired flexibility,Increased muscle spasms,Increased fascial restricitons,Pain  Visit Diagnosis: Cervicalgia  Muscle weakness (generalized)     Problem List Patient Active Problem List   Diagnosis Date Noted  . Status post total replacement of left hip 02/10/2019  . H/O total hip arthroplasty 02/10/2019  . Unilateral primary osteoarthritis, left hip 02/09/2019  . Trochanteric bursitis, left hip 04/19/2017  . It band syndrome, left 04/19/2017  . Leukocytopenia 03/08/2013  . Macular degeneration (senile) of retina 11/23/2012  . Anemia 11/23/2012  . Anxiety state 08/13/2012  . 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 Disorder of muscle, ligament, and fascia 08/13/2012  . Enthesopathy of hip region 08/13/2012  . Degenerative arthritis of hip 07/29/2012  . Melanoma of skin ([place]) 05/07/2009  .  Meningitis 05/07/2009    Scot Jun, PT, DPT, OCS, ATC 04/09/20  9:23 AM    [facility] [address] [place], [place], [address] Phone: [phone]   Fax:  [phone]  Name: Janara Klett MRN: [id number] Date of Birth: [birth year]

## 2020-04-11 ENCOUNTER — Encounter: Payer: Commercial Managed Care - PPO | Admitting: Rehabilitative and Restorative Service Providers"

## 2020-04-16 ENCOUNTER — Encounter: Payer: Commercial Managed Care - PPO | Admitting: Rehabilitative and Restorative Service Providers"

## 2020-04-18 ENCOUNTER — Other Ambulatory Visit: Payer: Self-pay

## 2020-04-18 ENCOUNTER — Encounter: Payer: Self-pay | Admitting: Rehabilitative and Restorative Service Providers"

## 2020-04-18 ENCOUNTER — Ambulatory Visit: Payer: BC Managed Care – PPO | Admitting: Rehabilitative and Restorative Service Providers"

## 2020-04-18 DIAGNOSIS — M6281 Muscle weakness (generalized): Secondary | ICD-10-CM

## 2020-04-18 DIAGNOSIS — M542 Cervicalgia: Secondary | ICD-10-CM

## 2020-04-18 NOTE — Therapy (Addendum)
[place], [place], [address] Phone: [phone]   Fax:  [phone]  Physical Therapy Treatment/Discharge  Patient Details  Name: Autumn Lindsey MRN: [id number] Date of Birth: 21-Nov-1950 Referring Provider (PT): Carney Bern   Encounter Date: 04/18/2020   PT End of Session - 04/18/20 0906    Visit Number 15    Number of Visits 16   adjusted to reflect date for POC   Date for PT Re-Evaluation 04/24/20    PT Start Time 0843    PT Stop Time 0921    PT Time Calculation (min) 38 min    Activity Tolerance Patient tolerated treatment well    Behavior During Therapy [facility] for tasks assessed/performed           Past Medical History:  Diagnosis Date  . Anemia   . Arthritis   . Depression   . GERD (gastroesophageal reflux disease)   . Headache(784.0)    in past  . History of melanoma excision   . PONV (postoperative nausea and vomiting)   . Unilateral primary osteoarthritis, left hip 02/09/2019    Past Surgical History:  Procedure Laterality Date  . APPENDECTOMY  1970's  . BREAST ENHANCEMENT SURGERY    . SKIN GRAFT  1980's  . TONSILLECTOMY    . TOTAL HIP ARTHROPLASTY Right 07/29/2012   Procedure: RIGHT TOTAL HIP ARTHROPLASTY ANTERIOR APPROACH;  Surgeon: Mcarthur Rossetti, MD;  Location: [facility];  Service: Orthopedics;  Laterality: Right;  . TOTAL HIP ARTHROPLASTY Left 02/10/2019   Procedure: LEFT TOTAL HIP ARTHROPLASTY ANTERIOR APPROACH;  Surgeon: Mcarthur Rossetti, MD;  Location: [facility];  Service: Orthopedics;  Laterality: Left;    There were no vitals filed for this visit.       [facility] PT Assessment - 04/18/20 0001      Observation/Other Assessments   Focus on Therapeutic Outcomes (FOTO)  68% update                         [facility] Adult PT Treatment/Exercise - 04/18/20 0001      Exercises   Other Exercises  Review of HEP      Neck Exercises: Machines for Strengthening   UBE  (Upper Arm Bike) 5 mins fwd/back lvl 4      Neck Exercises: Standing   Other Standing Exercises wall thoracic rotations blue band 2 x 10 bilateral    Other Standing Exercises tband rows, gh ext 3 x 15 blue      Manual Therapy   Manual therapy comments percussive device Lt upper trap, compression to Lt upper trap                       PT Long Term Goals - 03/13/20 1549      PT LONG TERM GOAL #1   Title Pt to be independent with final HEP    Baseline some compliance    Time 6    Period Weeks    Status On-going    Target Date 04/24/20      PT LONG TERM GOAL #2   Title Pt will improve neck ROM to [facility].    Baseline improving    Time 6    Period Weeks    Status On-going    Target Date 04/24/20      PT LONG TERM GOAL #3   Title Pt will report improved sleep quality due to less neck pain/discomfort  Baseline still bothers her with sleeping    Time 6    Period Weeks    Status Achieved      PT LONG TERM GOAL #4   Title Pt to report decreased neck pain to less than 2-3 overall with activity, work, neck ROM    Baseline has been at 2-3 over last week    Time 6    Period Weeks    Status On-going    Target Date 04/24/20      PT LONG TERM GOAL #5   Title FOTO functional outcome score will improve to 63% indicating improved function with less pain.    Baseline 53    Time 6    Period Weeks    Status Achieved                 Plan - 04/18/20 7543    Clinical Impression Statement Pt. to self trial HEP at this time with hold of PT due to improvement in symptoms and improvement in self management c HEP.  Hold of PT for now with d/c if no return in 30 days.    Examination-Activity Limitations Carry;Lift;Reach Overhead    Examination-Participation Restrictions Driving;Occupation    Stability/Clinical Decision Making Stable/Uncomplicated    Rehab Potential Good    PT Frequency --   1-2x/week   PT Duration 6 weeks    PT Treatment/Interventions ADLs/Self Care  Home Management;Cryotherapy;Electrical Stimulation;Iontophoresis 61m/ml Dexamethasone;Moist Heat;Traction;Ultrasound;Therapeutic exercise;Neuromuscular re-education;Manual techniques;Passive range of motion;Dry needling;Taping;Spinal Manipulations;Joint Manipulations    PT Next Visit Plan Hold PT for HEP trial.    PT Home Exercise Plan Access Code: [email or web address]   Consulted and Agree with Plan of Care Patient           Patient will benefit from skilled therapeutic intervention in order to improve the following deficits and impairments:  Decreased activity tolerance,Decreased mobility,Decreased range of motion,Decreased strength,Hypomobility,Impaired flexibility,Increased muscle spasms,Increased fascial restricitons,Pain  Visit Diagnosis: Cervicalgia  Muscle weakness (generalized)     Problem List Patient Active Problem List   Diagnosis Date Noted  . Status post total replacement of left hip 02/10/2019  . H/O total hip arthroplasty 02/10/2019  . Unilateral primary osteoarthritis, left hip 02/09/2019  . Trochanteric bursitis, left hip 04/19/2017  . It band syndrome, left 04/19/2017  . Leukocytopenia 03/08/2013  . Macular degeneration (senile) of retina 11/23/2012  . Anemia 11/23/2012  . Anxiety state 08/13/2012  . 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 Disorder of muscle, ligament, and fascia 08/13/2012  . Enthesopathy of hip region 08/13/2012  . Degenerative arthritis of hip 07/29/2012  . Melanoma of skin ([facility] 05/07/2009  . Meningitis 05/07/2009    MScot Jun PT, DPT, OCS, ATC 04/18/20  9:23 AM  PHYSICAL THERAPY DISCHARGE SUMMARY  Visits from Start of Care: 15  Current functional level related to goals / functional outcomes: See note   Remaining deficits: See note   Education / Equipment: HEP Plan: Patient agrees to discharge.  Patient goals were partially met. Patient is being discharged due to being pleased with the current functional level.  ?????  MScot Jun PT, DPT, OCS, ATC 06/12/20  2:55 PM        [facility] [address] [place] [address]Phone: [phone]  Fax:  [phone] Name: Autumn PinaMRN: [id number] of Birth: [birth year]

## 2021-11-09 IMAGING — RF DG HIP (WITH PELVIS) OPERATIVE*L*
1 series · 2 of 2 positions shown · non-contrast
Comparison: 05/16/2018

CLINICAL DATA: Left hip replacement

EXAM:
OPERATIVE left HIP (WITH PELVIS IF PERFORMED) 2 VIEWS
TECHNIQUE: Fluoroscopic spot image(s) were submitted for interpretation
post-operatively.

[Series 1: unknown protocol · 0.20mm/px · 2 of 2 slices shown]
[im 1/2]
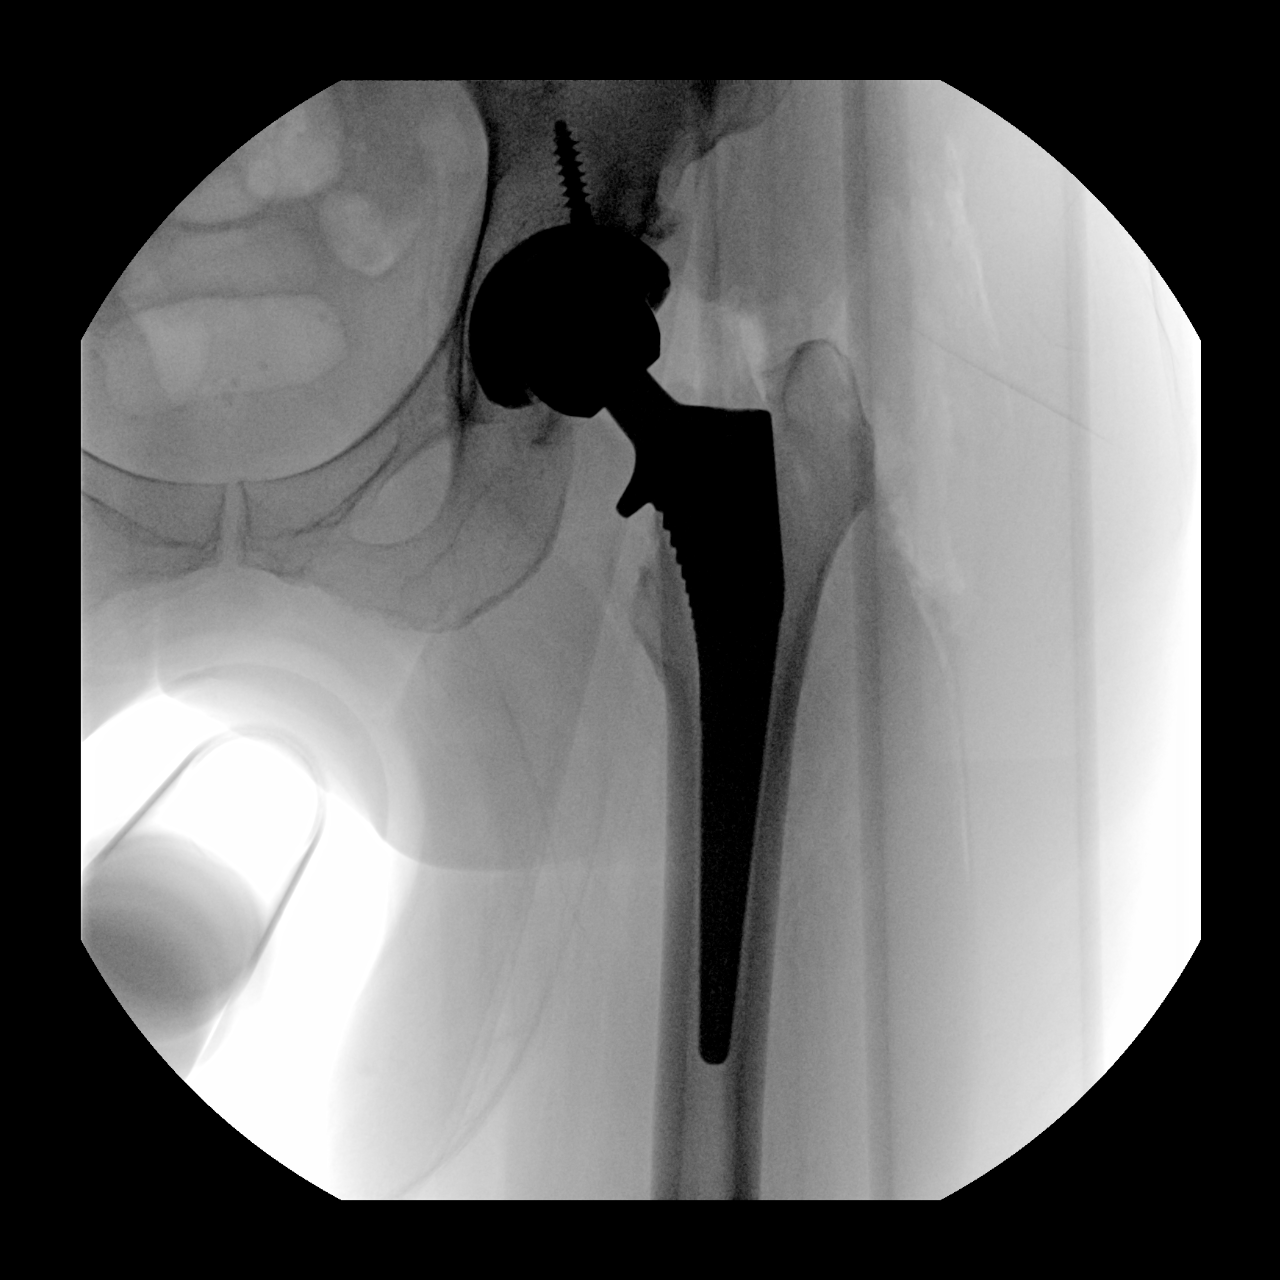
[im 2/2]
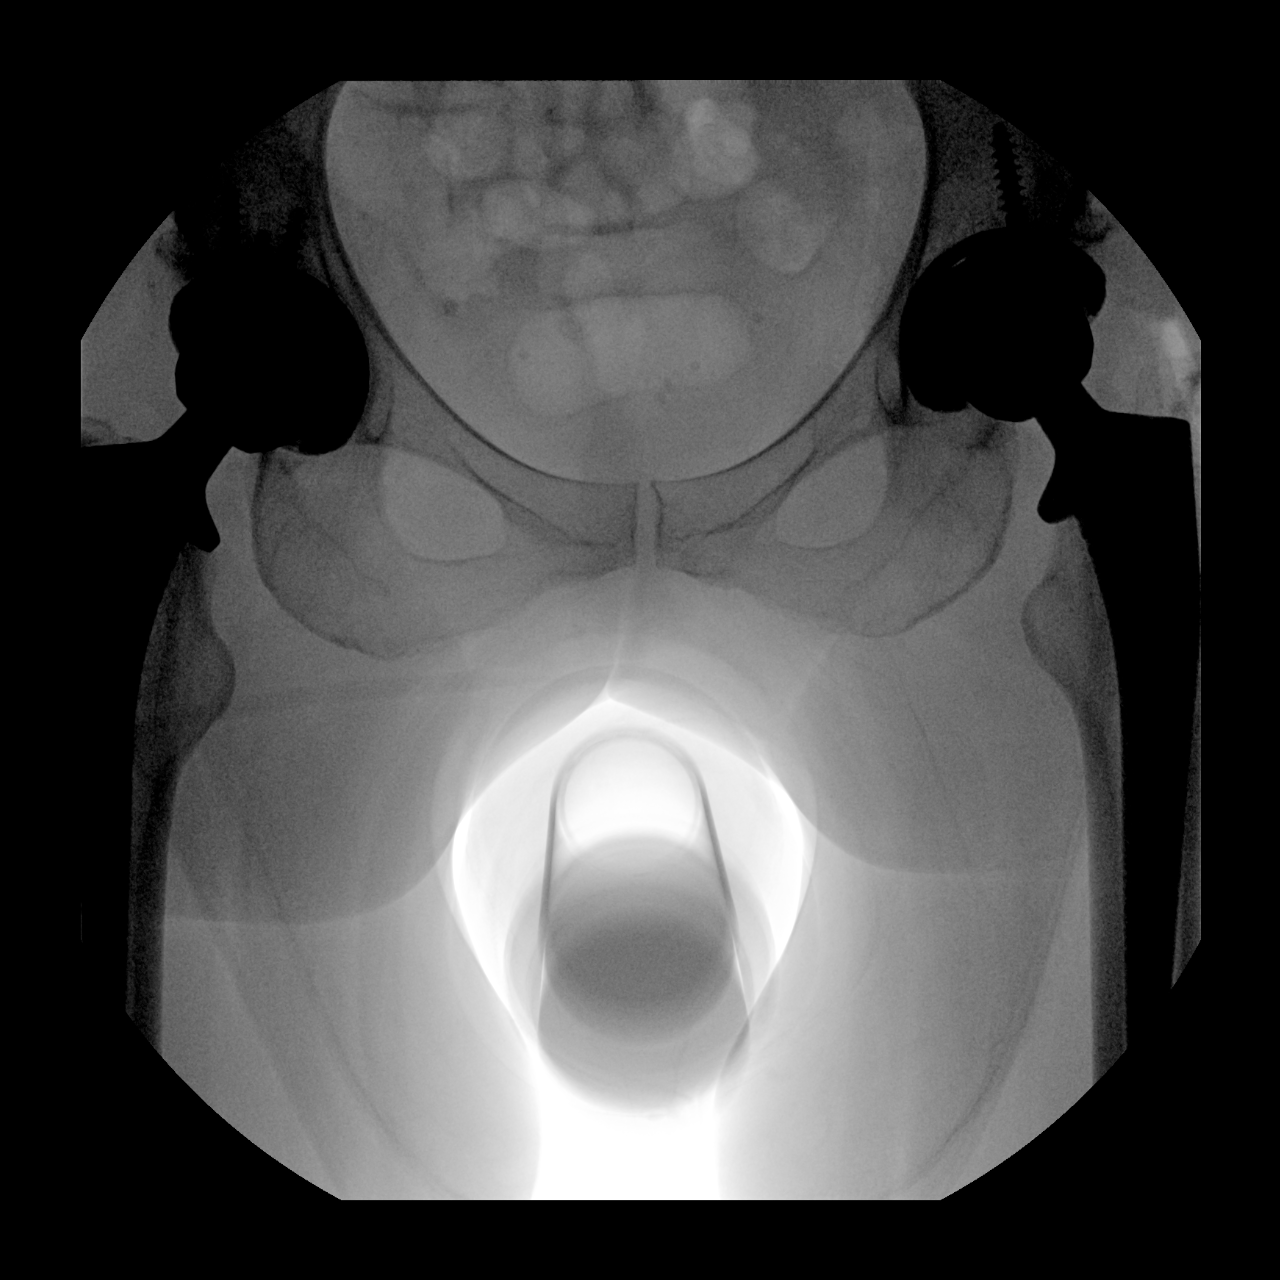

[2 of 2 positions shown; findings below may reference images not displayed]

FINDINGS: Two low resolution intraoperative spot views of the left hip. Total
fluoroscopy time was 13 seconds. Images demonstrate a left hip
replacement with normal alignment.
IMPRESSION: Intraoperative fluoroscopic assistance provided during left hip
replacement surgery.

## 2021-11-09 IMAGING — DX DG PORTABLE PELVIS
1 series · 1 of 1 positions shown · non-contrast
Comparison: Intraoperative views earlier the same date. Pelvic
radiographs 07/29/2012.

CLINICAL DATA: Postop left total hip arthroplasty.

EXAM:
PORTABLE PELVIS 1-2 VIEWS

[pelvis ap]
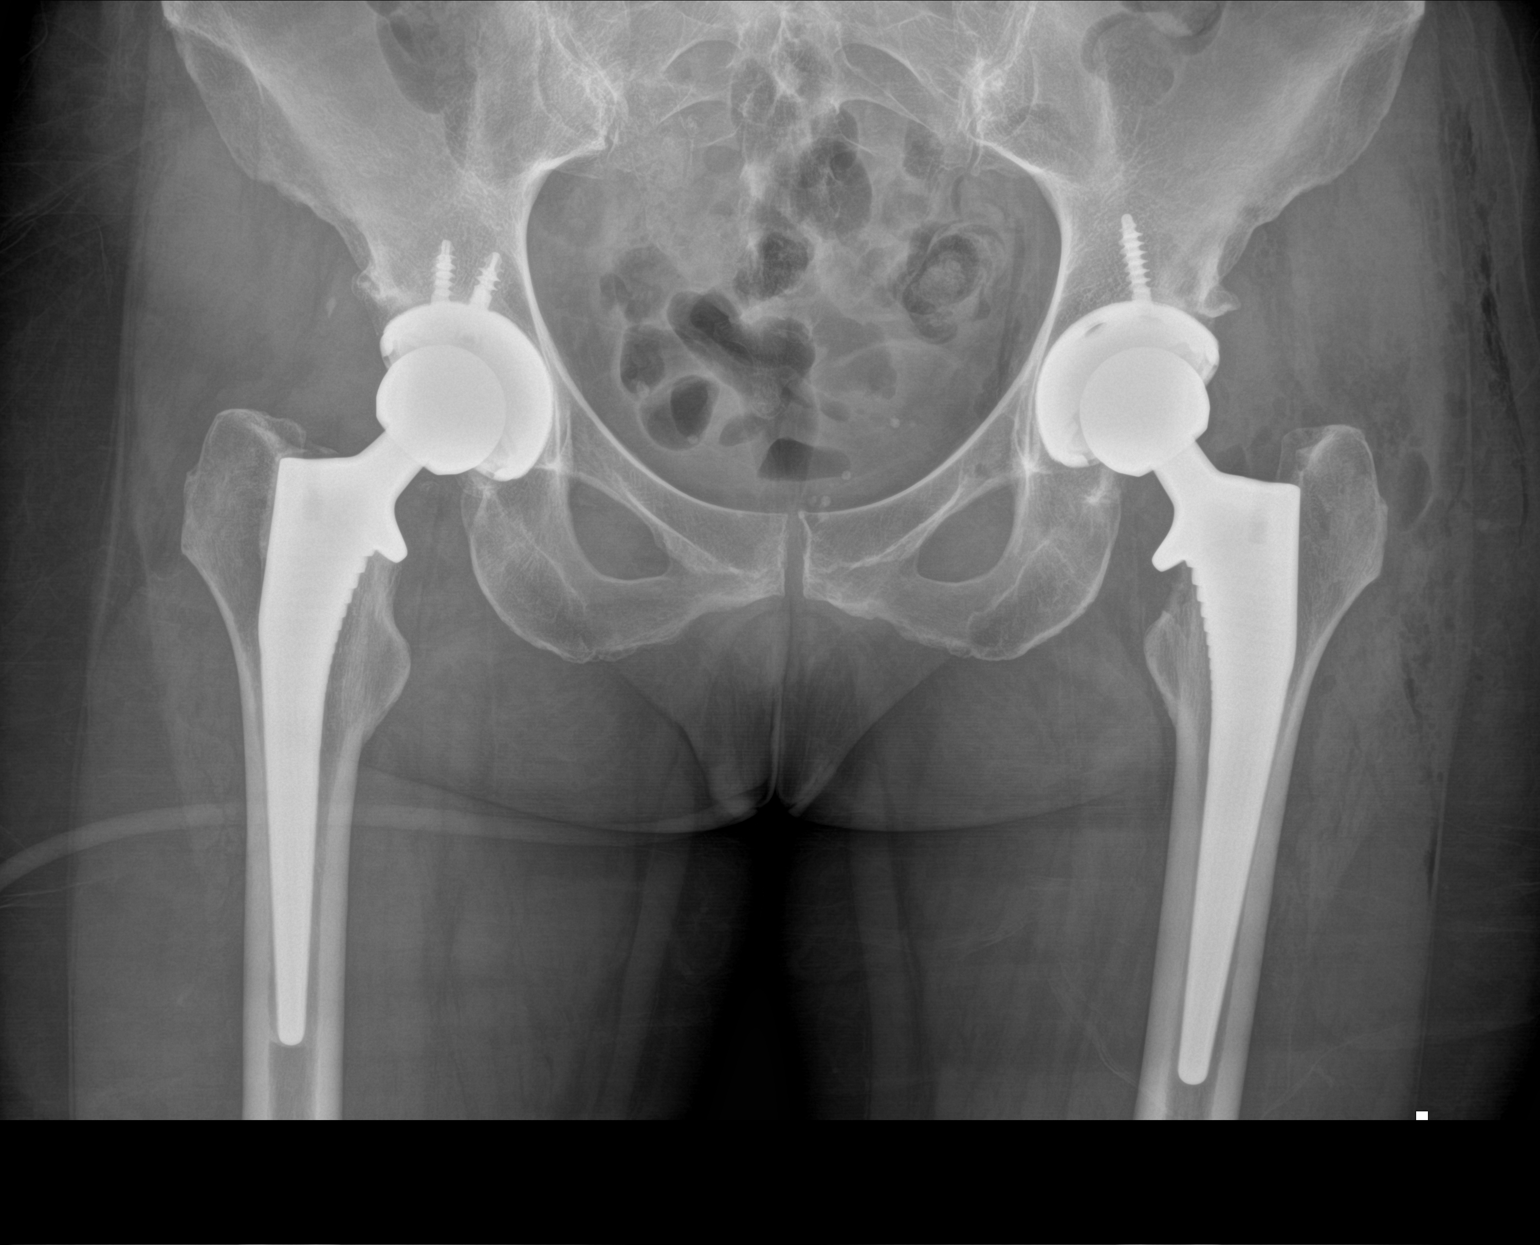

[1 of 1 positions shown; findings below may reference images not displayed]

FINDINGS: AP view of the lower pelvis demonstrates interval left total hip
arthroplasty with a screw fixed acetabular component. The hardware
is well positioned. There is no evidence of acute fracture or
dislocation. Previous right total hip arthroplasty appears
unchanged. There are mild sacroiliac degenerative changes
bilaterally. There is mild gas within the soft tissues surrounding
the left hip.
IMPRESSION: Interval left total hip arthroplasty without demonstrated
complication.
